# Patient Record
Sex: Female | Born: 1980 | Race: White | Hispanic: No | Marital: Single | Smoking: Never smoker
Health system: Southern US, Community
[De-identification: ages and names within clinical notes are randomized; demographics above are authoritative.]

## PROBLEM LIST (undated history)

## (undated) DIAGNOSIS — E049 Nontoxic goiter, unspecified: Secondary | ICD-10-CM

## (undated) DIAGNOSIS — E221 Hyperprolactinemia: Secondary | ICD-10-CM

## (undated) DIAGNOSIS — B259 Cytomegaloviral disease, unspecified: Secondary | ICD-10-CM

## (undated) HISTORY — PX: WISDOM TOOTH EXTRACTION: SHX21

## (undated) HISTORY — PX: NO PAST SURGERIES: SHX2092

## (undated) HISTORY — DX: Hyperprolactinemia: E22.1

---

## 2004-01-06 DIAGNOSIS — B259 Cytomegaloviral disease, unspecified: Secondary | ICD-10-CM

## 2004-01-06 HISTORY — DX: Cytomegaloviral disease, unspecified: B25.9

## 2011-04-23 ENCOUNTER — Other Ambulatory Visit: Payer: Self-pay | Admitting: Obstetrics and Gynecology

## 2011-04-23 DIAGNOSIS — N6320 Unspecified lump in the left breast, unspecified quadrant: Secondary | ICD-10-CM

## 2011-04-27 ENCOUNTER — Ambulatory Visit
Admission: RE | Admit: 2011-04-27 | Discharge: 2011-04-27 | Disposition: A | Discharge status: Home / Self Care | Payer: BC Managed Care – PPO | Source: Ambulatory Visit | Attending: Obstetrics and Gynecology | Admitting: Obstetrics and Gynecology

## 2011-04-27 DIAGNOSIS — N6320 Unspecified lump in the left breast, unspecified quadrant: Secondary | ICD-10-CM

## 2013-03-31 ENCOUNTER — Encounter (INDEPENDENT_AMBULATORY_CARE_PROVIDER_SITE_OTHER): Payer: Self-pay | Admitting: General Surgery

## 2013-03-31 ENCOUNTER — Ambulatory Visit (INDEPENDENT_AMBULATORY_CARE_PROVIDER_SITE_OTHER): Payer: BC Managed Care – PPO | Admitting: General Surgery

## 2013-03-31 DIAGNOSIS — K8020 Calculus of gallbladder without cholecystitis without obstruction: Secondary | ICD-10-CM | POA: Insufficient documentation

## 2013-03-31 NOTE — Progress Notes (Signed)
Patient ID: Miranda Norman, female   DOB: [birth year], 33 y.o.   MRN: [id number]  Chief Complaint  Patient presents with  . New Evaluation    eval GB    HPI Miranda Norman is a 33 y.o. female.  She is referred by Dr. Keturah Barreobert Robbins at [facility] [place] physician [place] in [place] for evaluation and management of symptomatic gallstones.  This patient began having intermittent episodes of right upper quadrant pain in August or September of last year. Usually nocturnal, sometimes after Bar-B-Q or [place] food. A couple of episodes last fall were severe. These are always self-limited and will occur about every 2 weeks. No nausea vomiting fever or diarrhea.  In October of 2014 she had lab work which was normal. She had an ultrasound which showed a few small gallstones and normal common bile duct. Things  settled down but now she is developed recurrent attacks. Nothing as severe as it was last fall but still troublesome and sometimes postprandially. There's been no history of liver disease cardiac disease pulmonary disease or urologic problems.She is in no distress today.  Past history reveals that she is healthy. Takes birth control pills. Developed a rash to penicillin as a child  History reveals her mother had a complicated gallbladder operation with prolonged drainage possible common duct stones ultimately resolved [facility]. Grandmother had breast cancer.  SH - she is single has no children has a fiancem,  works for a [employer], denies tobacco.  HPI  History reviewed. No pertinent past medical history.  History reviewed. No pertinent past surgical history.  Family History  Problem Relation Age of Onset  . Asthma Mother     Social History History  Substance Use Topics  . Smoking status: Never Smoker   . Smokeless tobacco: Never Used  . Alcohol Use: Yes     Comment: occ    Allergies  Allergen Reactions  . Ceclor [Cefaclor]   . Penicillins Hives     Current Outpatient Prescriptions  Medication Sig Dispense Refill  . Norethindrone Acetate-Ethinyl Estrad-FE (GILDESS 24 FE) 1-20 MG-MCG(24) tablet Take 1 tablet by mouth daily.       No current facility-administered medications for this visit.    Review of Systems Review of Systems  Constitutional: Negative for fever, chills and unexpected weight change.  HENT: Negative for congestion, hearing loss, sore throat, trouble swallowing and voice change.   Eyes: Negative for visual disturbance.  Respiratory: Negative for cough and wheezing.   Cardiovascular: Negative for chest pain, palpitations and leg swelling.  Gastrointestinal: Positive for abdominal pain. Negative for nausea, vomiting, diarrhea, constipation, blood in stool, abdominal distention and anal bleeding.  Genitourinary: Negative for hematuria, vaginal bleeding and difficulty urinating.  Musculoskeletal: Negative for arthralgias.  Skin: Negative for rash and wound.  Neurological: Negative for seizures, syncope and headaches.  Hematological: Negative for adenopathy. Does not bruise/bleed easily.  Psychiatric/Behavioral: Negative for confusion.    There were no vitals taken for this visit.  Physical Exam Physical Exam  Constitutional: She is oriented to person, place, and time. She appears well-developed and well-nourished. No distress.  HENT:  Head: Normocephalic and atraumatic.  Nose: Nose normal.  Mouth/Throat: No oropharyngeal exudate.  Eyes: Conjunctivae and EOM are normal. Pupils are equal, round, and reactive to light. Left eye exhibits no discharge. No scleral icterus.  Neck: Neck supple. No JVD present. No tracheal deviation present. No thyromegaly present.  Cardiovascular: Normal rate, regular rhythm, normal heart sounds and  intact distal pulses.   No murmur heard. Pulmonary/Chest: Effort normal and breath sounds normal. No respiratory distress. She has no wheezes. She has no rales. She exhibits no  tenderness.  Abdominal: Soft. Bowel sounds are normal. She exhibits no distension and no mass. There is no tenderness. There is no rebound and no guarding.  Musculoskeletal: She exhibits no edema and no tenderness.  Lymphadenopathy:    She has no cervical adenopathy.  Neurological: She is alert and oriented to person, place, and time. She exhibits normal muscle tone. Coordination normal.  Skin: Skin is warm. No rash noted. She is not diaphoretic. No erythema. No pallor.  Psychiatric: She has a normal mood and affect. Her behavior is normal. Judgment and thought content normal.    Data Reviewed Dr. Sherral Hammers office notes, lab work, ultrasound  Assessment    Chronic cholecystitis with cholelithiasis. She is having fairly typical episodes of biliary colic, but no complications to date.     Plan    I advised her to have an elective laparoscopic cholecystectomy with cholangiogram, possible open. With a long talk about this. She knows that she needs to do this. She wants to go home and discuss this with her mother and call back  to schedule.  Low-fat diet advised  I discussed the indications, details, techniques, and numerous risks of gallbladder surgery with her. She's where the risk of bleeding, infection, conversion to open laparotomy, injury to adjacent organs with major apparent, bile leak, wound hernia and other unforeseen problems. She understands all these issues. All of her questions are answered. She agrees with this plan.        Angelia Mould. Derrell Lolling, M.D., [facility] General and Minimally invasive Surgery Breast and Colorectal Surgery Office:   [phone] Pager:   [phone]  03/31/2013, 3:29 PM

## 2013-03-31 NOTE — Patient Instructions (Signed)
You are having gallbladder attacks due to your gallstones.  This will continue until you have an operation.  I don't think you have had a complication yet.  Please call back when you are ready to schedule your laparoscopic cholecystectomy with cholangiogram.    Laparoscopic Cholecystectomy Laparoscopic cholecystectomy is surgery to remove the gallbladder. The gallbladder is located in the upper right part of the abdomen, behind the liver. It is a storage sac for bile produced in the liver. Bile aids in the digestion and absorption of fats. Cholecystectomy is often done for inflammation of the gallbladder (cholecystitis). This condition is usually caused by a buildup of gallstones (cholelithiasis) in your gallbladder. Gallstones can block the flow of bile, resulting in inflammation and pain. In severe cases, emergency surgery may be required. When emergency surgery is not required, you will have time to prepare for the procedure. Laparoscopic surgery is an alternative to open surgery. Laparoscopic surgery has a shorter recovery time. Your common bile duct may also need to be examined during the procedure. If stones are found in the common bile duct, they may be removed. LET [facility] PROVID[facility] KNOW ABOUT:  Any allergies you have.  All medicines you are taking, including vitamins, herbs, eye drops, creams, and over-the-counter medicines.  Previous problems you or members of your family have had with the use of anesthetics.  Any blood disorders you have.  Previous surgeries you have had.  Medical conditions you have. RISKS AND COMPLICATIONS Generally, this is a safe procedure. However, as with any procedure, complications can occur. Possible complications include:  Infection.  Damage to the common bile duct, nerves, arteries, veins, or other internal organs such as the stomach, liver, or intestines.  Bleeding.  A stone may remain in the common bile duct.  A bile leak from the  cyst duct that is clipped when your gallbladder is removed.  The need to convert to open surgery, which requires a larger incision in the abdomen. This may be necessary if your surgeon thinks it is not safe to continue with a laparoscopic procedure. BEFORE THE PROCEDURE  Ask your health care provider about changing or stopping any regular medicines. You will need to stop taking aspirin or blood thinners at least 5 days prior to surgery.  Do not eat or drink anything after midnight the night before surgery.  Let your health care provider know if you develop a cold or other infectious problem before surgery. PROCEDURE   You will be given medicine to make you sleep through the procedure (general anesthetic). A breathing tube will be placed in your mouth.  When you are asleep, your surgeon will make several small cuts (incisions) in your abdomen.  A thin, lighted tube with a tiny camera on the end (laparoscope) is inserted through one of the small incisions. The camera on the laparoscope sends a picture to a TV screen in the operating room. This gives the surgeon a good view inside your abdomen.  A gas will be pumped into your abdomen. This expands your abdomen so that the surgeon has more room to perform the surgery.  Other tools needed for the procedure are inserted through the other incisions. The gallbladder is removed through one of the incisions.  After the removal of your gallbladder, the incisions will be closed with stitches, staples, or skin glue. AFTER THE PROCEDURE  You will be taken to a recovery area where your progress will be checked often.  You may be allowed to go  home the same day if your pain is controlled and you can tolerate liquids. Document Released: 12/22/2004 Document Revised: 10/12/2012 Document Reviewed: 08/03/2012 [facility] Patient Information 2014 [place], [place].

## 2013-04-03 ENCOUNTER — Other Ambulatory Visit (INDEPENDENT_AMBULATORY_CARE_PROVIDER_SITE_OTHER): Payer: Self-pay | Admitting: General Surgery

## 2013-04-03 ENCOUNTER — Telehealth (INDEPENDENT_AMBULATORY_CARE_PROVIDER_SITE_OTHER): Payer: Self-pay | Admitting: General Surgery

## 2013-04-03 NOTE — H&P (Signed)
Miranda GrainCrystal J Norman   MRN:  [id number]   Description: 33 year old female  Provider: Ernestene MentionHaywood M Daivon Rayos, MD  Department: [facility]         Diagnoses      Gallstones    -  Primary      574.20            History and Physical   Ernestene MentionHaywood M Miranda Brocato, MD    Status: Signed            Patient ID: Miranda Grainrystal J Marney, female   DOB: December 31, 1980, 33 y.o.   MRN: [id number]                HPI Miranda Norman is a 33 y.o. female.  She is referred by Dr. Keturah Barreobert Robbins at [facility] family physician is in [place] for evaluation and management of symptomatic gallstones.   This patient began having intermittent episodes of right upper quadrant pain in August or September of last year. Usually nocturnal, sometimes after Bar-B-Q or [place] food. A couple of episodes last fall were severe. These are always self-limited and will occur about every 2 weeks. No nausea vomiting fever or diarrhea.   In October of 2014 she had lab work which was normal. She had an ultrasound which showed a few small gallstones and normal common bile duct. Things  settled down but now she is developed recurrent attacks. Nothing as severe as it was last fall but still troublesome and sometimes postprandially. There's been no history of liver disease cardiac disease pulmonary disease or urologic problems.She is in no distress today.   Past history reveals that she is healthy. Takes birth control pills. Developed a rash to penicillin as a child   History reveals her mother had a complicated gallbladder operation with prolonged drainage possible common duct stones ultimately resolved with a [facility]. Grandmother had breast cancer.   SH - she is single has no children has a fiance,  works for a [employer], denies tobacco.           Family History   Problem  Relation  Age of Onset   .  Asthma  Mother        Social History History   Substance Use Topics   .  Smoking status:  Never  Smoker    .  Smokeless tobacco:  Never Used   .  Alcohol Use:  Yes         Comment: occ        Allergies   Allergen  Reactions   .  Ceclor [Cefaclor]     .  Penicillins  Hives         Current Outpatient Prescriptions   Medication  Sig  Dispense  Refill   .  Norethindrone Acetate-Ethinyl Estrad-FE (GILDESS 24 FE) 1-20 MG-MCG(24) tablet  Take 1 tablet by mouth daily.                Review of Systems   Constitutional: Negative for fever, chills and unexpected weight change.  HENT: Negative for congestion, hearing loss, sore throat, trouble swallowing and voice change.   Eyes: Negative for visual disturbance.  Respiratory: Negative for cough and wheezing.   Cardiovascular: Negative for chest pain, palpitations and leg swelling.  Gastrointestinal: Positive for abdominal pain. Negative for nausea, vomiting, diarrhea, constipation, blood in stool, abdominal distention and anal bleeding.  Genitourinary: Negative for hematuria, vaginal bleeding and difficulty urinating.  Musculoskeletal: Negative for  arthralgias.  Skin: Negative for rash and wound.  Neurological: Negative for seizures, syncope and headaches.  Hematological: Negative for adenopathy. Does not bruise/bleed easily.  Psychiatric/Behavioral: Negative for confusion.        Physical Exam  Constitutional: She is oriented to person, place, and time. She appears well-developed and well-nourished. No distress.  HENT:   Head: Normocephalic and atraumatic.   Nose: Nose normal.   Mouth/Throat: No oropharyngeal exudate.  Eyes: Conjunctivae and EOM are normal. Pupils are equal, round, and reactive to light. Left eye exhibits no discharge. No scleral icterus.  Neck: Neck supple. No JVD present. No tracheal deviation present. No thyromegaly present.  Cardiovascular: Normal rate, regular rhythm, normal heart sounds and intact distal pulses.    No murmur heard. Pulmonary/Chest: Effort normal and breath sounds normal. No  respiratory distress. She has no wheezes. She has no rales. She exhibits no tenderness.  Abdominal: Soft. Bowel sounds are normal. She exhibits no distension and no mass. There is no tenderness. There is no rebound and no guarding.  Musculoskeletal: She exhibits no edema and no tenderness.  Lymphadenopathy:    She has no cervical adenopathy.  Neurological: She is alert and oriented to person, place, and time. She exhibits normal muscle tone. Coordination normal.  Skin: Skin is warm. No rash noted. She is not diaphoretic. No erythema. No pallor.  Psychiatric: She has a normal mood and affect. Her behavior is normal. Judgment and thought content normal.      Data Reviewed Dr. Sherral Hammers office notes, lab work, ultrasound   Assessment    Chronic cholecystitis with cholelithiasis. She is having fairly typical episodes of biliary colic, but no complications to date.      Plan    I advised her to have an elective laparoscopic cholecystectomy with cholangiogram, possible open. With a long talk about this. She knows that she needs to do this. She wants to go home and discuss this with her mother and call back  to schedule.   Low-fat diet advised   I discussed the indications, details, techniques, and numerous risks of gallbladder surgery with her. She's where the risk of bleeding, infection, conversion to open laparotomy, injury to adjacent organs with major apparent, bile leak, wound hernia and other unforeseen problems. She understands all these issues. All of her questions are answered. She agrees with this plan.          Angelia Mould. Derrell Lolling, M.D., [facility] General and Minimally invasive Surgery Breast and Colorectal Surgery Office:   [phone] Pager:   [phone]

## 2013-04-03 NOTE — Telephone Encounter (Signed)
Pt called into surgery scheduling to ask what day this week her surgery can be?  States you informed her she could go this week during your emergency cases as she is back packing at the end of may  There are no orders

## 2013-04-03 NOTE — Telephone Encounter (Signed)
[facility]

## 2013-04-03 NOTE — Telephone Encounter (Signed)
Orders and posting template entered into at that. Okay to do surgery any day this week.   hmi

## 2013-04-03 NOTE — Patient Instructions (Addendum)
20 Mairely J Mcwhirt  04/03/2013   Your procedure is scheduled on: 04/05/13  Report to [facility] at 7:30 AM.  Call this number if you have problems the morning of surgery [phone]: [phone]   Remember:   Do not eat food or drink liquids After Midnight.   Do not wear jewelry, make-up or nail polish.  Do not wear lotions, powders, or perfumes. You may wear deodorant.  Do not shave 48 hours prior to surgery. Men may shave face and neck.  Do not bring valuables to the hospital.  Contacts, dentures or bridgework may not be worn into surgery.   Patients discharged the day of surgery will not be allowed to drive home.  Name and phone number of your driver: Earl ManyDaniel Kern [phone]    Please read over the following fact sheets that you were given:[place] preparing for surgery sheet  Birdie Sonsachel Simon Llamas, RN  pre op nurse call if needed [phone]    FAILURE TO FOLLOW THESE INSTRUCTIONS MAY RESULT IN CANCELLATION OF YOUR SURGERY   Patient Signature: ___________________________________________

## 2013-04-04 ENCOUNTER — Encounter (HOSPITAL_COMMUNITY): Payer: Self-pay

## 2013-04-04 ENCOUNTER — Encounter (HOSPITAL_COMMUNITY): Payer: Self-pay | Admitting: Pharmacy Technician

## 2013-04-04 ENCOUNTER — Encounter (HOSPITAL_COMMUNITY)
Admission: RE | Admit: 2013-04-04 | Discharge: 2013-04-04 | Disposition: A | Discharge status: Home / Self Care | Payer: BC Managed Care – PPO | Source: Ambulatory Visit | Attending: General Surgery | Admitting: General Surgery

## 2013-04-04 HISTORY — DX: Cytomegaloviral disease, unspecified: B25.9

## 2013-04-04 HISTORY — DX: Nontoxic goiter, unspecified: E04.9

## 2013-04-04 LAB — CBC WITH DIFFERENTIAL/PLATELET
Basophils Absolute: 0 10*3/uL (ref 0.0–0.1)
Basophils Relative: 0 % (ref 0–1)
Eosinophils Absolute: 0.2 10*3/uL (ref 0.0–0.7)
Eosinophils Relative: 2 % (ref 0–5)
HCT: 42.9 % (ref 36.0–46.0)
Hemoglobin: 14.9 g/dL (ref 12.0–15.0)
Lymphocytes Relative: 24 % (ref 12–46)
Lymphs Abs: 1.8 10*3/uL (ref 0.7–4.0)
MCH: 29.4 pg (ref 26.0–34.0)
MCHC: 34.7 g/dL (ref 30.0–36.0)
MCV: 84.6 fL (ref 78.0–100.0)
Monocytes Absolute: 0.4 10*3/uL (ref 0.1–1.0)
Monocytes Relative: 5 % (ref 3–12)
Neutro Abs: 5.1 10*3/uL (ref 1.7–7.7)
Neutrophils Relative %: 69 % (ref 43–77)
Platelets: UNDETERMINED 10*3/uL (ref 150–400)
RBC: 5.07 MIL/uL (ref 3.87–5.11)
RDW: 13 % (ref 11.5–15.5)
WBC: 7.5 10*3/uL (ref 4.0–10.5)

## 2013-04-04 LAB — COMPREHENSIVE METABOLIC PANEL
ALT: 20 U/L (ref 0–35)
AST: 27 U/L (ref 0–37)
Albumin: 3.8 g/dL (ref 3.5–5.2)
Alkaline Phosphatase: 54 U/L (ref 39–117)
BUN: 10 mg/dL (ref 6–23)
CO2: 25 mEq/L (ref 19–32)
Calcium: 9.3 mg/dL (ref 8.4–10.5)
Chloride: 101 mEq/L (ref 96–112)
Creatinine, Ser: 0.67 mg/dL (ref 0.50–1.10)
GFR calc Af Amer: >90 mL/min (ref >90)
GFR calc non Af Amer: >90 mL/min (ref >90)
Glucose, Bld: 94 mg/dL (ref 70–99)
Potassium: 4.3 mEq/L (ref 3.7–5.3)
Sodium: 138 mEq/L (ref 137–147)
Total Bilirubin: 0.4 mg/dL (ref 0.3–1.2)
Total Protein: 6.9 g/dL (ref 6.0–8.3)

## 2013-04-04 LAB — HCG, SERUM, QUALITATIVE: Preg, Serum: NEGATIVE

## 2013-04-04 NOTE — H&P (Signed)
Miranda Norman    MRN:  [id number]   Description: 33 year old female  Provider: Ernestene Mention, MD  Department: [facility]          Diagnoses      Gallstones    -  Primary      574.20           History and Physical   Ernestene Mention, MD       Status: Signed            Patient ID: Miranda Norman, female   DOB: Apr 29, 1980, 33 y.o.   MRN: [id number]               HPI Miranda Norman is a 34 y.o. female.  She is referred by Dr. Keturah Barre at [facility] family physician is in [place] for evaluation and management of symptomatic gallstones.   This patient began having intermittent episodes of right upper quadrant pain in August or September of last year. Usually nocturnal, sometimes after Bar-B-Q or [place] food. A couple of episodes last fall were severe. These are always self-limited and will occur about every 2 weeks. No nausea vomiting fever or diarrhea.   In October of 2014 she had lab work which was normal. She had an ultrasound which showed a few small gallstones and normal common bile duct. Things  settled down but now she is developed recurrent attacks. Nothing as severe as it was last fall but still troublesome and sometimes postprandially. There's been no history of liver disease cardiac disease pulmonary disease or urologic problems.She is in no distress today.   Past history reveals that she is healthy. Takes birth control pills. Developed a rash to penicillin as a child   History reveals her mother had a complicated gallbladder operation with prolonged drainage possible common duct stones ultimately resolved with a stintt at [facility]. Grandmother had breast cancer.   SH - she is single has no children has a fiancem,  works for a [employer], denies tobacco.           Family History   Problem  Relation  Age of Onset   .  Asthma  Mother          Social History History   Substance Use Topics   .  Smoking status:   Never Smoker    .  Smokeless tobacco:  Never Used   .  Alcohol Use:  Yes         Comment: occ         Allergies   Allergen  Reactions   .  Ceclor [Cefaclor]     .  Penicillins  Hives         Current Outpatient Prescriptions   Medication  Sig  Dispense  Refill   .  Norethindrone Acetate-Ethinyl Estrad-FE (GILDESS 24 FE) 1-20 MG-MCG(24) tablet  Take 1 tablet by mouth daily.                  Review of Systems  Constitutional: Negative for fever, chills and unexpected weight change.  HENT: Negative for congestion, hearing loss, sore throat, trouble swallowing and voice change.   Eyes: Negative for visual disturbance.  Respiratory: Negative for cough and wheezing.   Cardiovascular: Negative for chest pain, palpitations and leg swelling.  Gastrointestinal: Positive for abdominal pain. Negative for nausea, vomiting, diarrhea, constipation, blood in stool, abdominal distention and anal bleeding.  Genitourinary: Negative for hematuria, vaginal bleeding and  difficulty urinating.  Musculoskeletal: Negative for arthralgias.  Skin: Negative for rash and wound.  Neurological: Negative for seizures, syncope and headaches.  Hematological: Negative for adenopathy. Does not bruise/bleed easily.  Psychiatric/Behavioral: Negative for confusion.       Physical Exam   Constitutional: She is oriented to person, place, and time. She appears well-developed and well-nourished. No distress.  HENT:   Head: Normocephalic and atraumatic.   Nose: Nose normal.   Mouth/Throat: No oropharyngeal exudate.  Eyes: Conjunctivae and EOM are normal. Pupils are equal, round, and reactive to light. Left eye exhibits no discharge. No scleral icterus.  Neck: Neck supple. No JVD present. No tracheal deviation present. No thyromegaly present.  Cardiovascular: Normal rate, regular rhythm, normal heart sounds and intact distal pulses.    No murmur heard. Pulmonary/Chest: Effort normal and breath sounds  normal. No respiratory distress. She has no wheezes. She has no rales. She exhibits no tenderness.  Abdominal: Soft. Bowel sounds are normal. She exhibits no distension and no mass. There is no tenderness. There is no rebound and no guarding.  Musculoskeletal: She exhibits no edema and no tenderness.  Lymphadenopathy:    She has no cervical adenopathy.  Neurological: She is alert and oriented to person, place, and time. She exhibits normal muscle tone. Coordination normal.  Skin: Skin is warm. No rash noted. She is not diaphoretic. No erythema. No pallor.  Psychiatric: She has a normal mood and affect. Her behavior is normal. Judgment and thought content normal.      Data Reviewed Dr. Sherral Hammersobbins office notes, lab work, ultrasound   Assessment    Chronic cholecystitis with cholelithiasis. She is having fairly typical episodes of biliary colic, but no complications to date.      Plan    I advised her to have an elective laparoscopic cholecystectomy with cholangiogram, possible open. With a long talk about this. She knows that she needs to do this. She wants to go home and discuss this with her mother and call back  to schedule.   Low-fat diet advised   I discussed the indications, details, techniques, and numerous risks of gallbladder surgery with her. She's where the risk of bleeding, infection, conversion to open laparotomy, injury to adjacent organs with major apparent, bile leak, wound hernia and other unforeseen problems. She understands all these issues. All of her questions are answered. She agrees with this plan.           Angelia MouldHaywood M. Derrell LollingIngram, M.D., [facility] General and Minimally invasive Surgery Breast and Colorectal Surgery Office:   [phone] Pager:   [phone]

## 2013-04-05 ENCOUNTER — Encounter (HOSPITAL_COMMUNITY): Payer: Self-pay | Admitting: *Deleted

## 2013-04-05 ENCOUNTER — Ambulatory Visit (HOSPITAL_COMMUNITY)
Admission: RE | Admit: 2013-04-05 | Discharge: 2013-04-05 | Disposition: A | Discharge status: Home / Self Care | Payer: BC Managed Care – PPO | Source: Ambulatory Visit | Attending: General Surgery | Admitting: General Surgery

## 2013-04-05 ENCOUNTER — Encounter (HOSPITAL_COMMUNITY)
Admission: RE | Disposition: A | Discharge status: Home / Self Care | Payer: Self-pay | Source: Ambulatory Visit | Attending: General Surgery

## 2013-04-05 ENCOUNTER — Encounter (HOSPITAL_COMMUNITY): Payer: BC Managed Care – PPO | Admitting: Anesthesiology

## 2013-04-05 ENCOUNTER — Ambulatory Visit (HOSPITAL_COMMUNITY): Payer: BC Managed Care – PPO

## 2013-04-05 ENCOUNTER — Ambulatory Visit (HOSPITAL_COMMUNITY): Payer: BC Managed Care – PPO | Admitting: Anesthesiology

## 2013-04-05 DIAGNOSIS — K8010 Calculus of gallbladder with chronic cholecystitis without obstruction: Secondary | ICD-10-CM

## 2013-04-05 DIAGNOSIS — Z803 Family history of malignant neoplasm of breast: Secondary | ICD-10-CM | POA: Insufficient documentation

## 2013-04-05 DIAGNOSIS — Z79899 Other long term (current) drug therapy: Secondary | ICD-10-CM | POA: Insufficient documentation

## 2013-04-05 DIAGNOSIS — K8020 Calculus of gallbladder without cholecystitis without obstruction: Secondary | ICD-10-CM | POA: Diagnosis present

## 2013-04-05 DIAGNOSIS — R1011 Right upper quadrant pain: Secondary | ICD-10-CM | POA: Insufficient documentation

## 2013-04-05 HISTORY — PX: CHOLECYSTECTOMY: SHX55

## 2013-04-05 SURGERY — LAPAROSCOPIC CHOLECYSTECTOMY WITH INTRAOPERATIVE CHOLANGIOGRAM
Anesthesia: General | Site: Abdomen

## 2013-04-05 MED ORDER — NEOSTIGMINE METHYLSULFATE 1 MG/ML IJ SOLN
INTRAMUSCULAR | Status: DC | PRN
  Administered 2013-04-05: 4 mg via INTRAVENOUS

## 2013-04-05 MED ORDER — MIDAZOLAM HCL 2 MG/2ML IJ SOLN
INTRAMUSCULAR | Status: AC
  Filled 2013-04-05: qty 2

## 2013-04-05 MED ORDER — DEXAMETHASONE SODIUM PHOSPHATE 10 MG/ML IJ SOLN
INTRAMUSCULAR | Status: DC | PRN
  Administered 2013-04-05: 10 mg via INTRAVENOUS

## 2013-04-05 MED ORDER — ROCURONIUM BROMIDE 100 MG/10ML IV SOLN
INTRAVENOUS | Status: AC
  Filled 2013-04-05: qty 1

## 2013-04-05 MED ORDER — PROMETHAZINE HCL 25 MG/ML IJ SOLN
INTRAMUSCULAR | Status: AC
  Filled 2013-04-05: qty 1

## 2013-04-05 MED ORDER — ONDANSETRON HCL 4 MG/2ML IJ SOLN
INTRAMUSCULAR | Status: AC
  Filled 2013-04-05: qty 2

## 2013-04-05 MED ORDER — CIPROFLOXACIN IN D5W 400 MG/200ML IV SOLN
INTRAVENOUS | Status: AC
  Filled 2013-04-05: qty 200

## 2013-04-05 MED ORDER — HYDROMORPHONE HCL PF 1 MG/ML IJ SOLN
0.2500 mg | INTRAMUSCULAR | Status: DC | PRN
  Administered 2013-04-05 (×2): 0.25 mg via INTRAVENOUS

## 2013-04-05 MED ORDER — SODIUM CHLORIDE 0.9 % IJ SOLN: 3.0000 mL | Freq: Two times a day (BID) | INTRAMUSCULAR | Status: DC

## 2013-04-05 MED ORDER — DEXAMETHASONE SODIUM PHOSPHATE 10 MG/ML IJ SOLN
INTRAMUSCULAR | Status: AC
  Filled 2013-04-05: qty 1

## 2013-04-05 MED ORDER — SUCCINYLCHOLINE CHLORIDE 20 MG/ML IJ SOLN
INTRAMUSCULAR | Status: DC | PRN
Start: 2013-04-05 — End: 2013-04-05
  Administered 2013-04-05: 100 mg via INTRAVENOUS

## 2013-04-05 MED ORDER — HYDROMORPHONE HCL PF 1 MG/ML IJ SOLN
INTRAMUSCULAR | Status: AC
  Filled 2013-04-05: qty 1

## 2013-04-05 MED ORDER — BUPIVACAINE-EPINEPHRINE PF 0.5-1:200000 % IJ SOLN
INTRAMUSCULAR | Status: AC
  Filled 2013-04-05: qty 30

## 2013-04-05 MED ORDER — SODIUM CHLORIDE 0.9 % IJ SOLN: 3.0000 mL | INTRAMUSCULAR | Status: DC | PRN

## 2013-04-05 MED ORDER — SODIUM CHLORIDE 0.9 % IV SOLN: 250.0000 mL | INTRAVENOUS | Status: DC | PRN

## 2013-04-05 MED ORDER — GLYCOPYRROLATE 0.2 MG/ML IJ SOLN
INTRAMUSCULAR | Status: AC
  Filled 2013-04-05: qty 3

## 2013-04-05 MED ORDER — BUPIVACAINE-EPINEPHRINE 0.5% -1:200000 IJ SOLN
INTRAMUSCULAR | Status: DC | PRN
  Administered 2013-04-05: 15 mL

## 2013-04-05 MED ORDER — BUPIVACAINE-EPINEPHRINE PF 0.5-1:200000 % IJ SOLN
INTRAMUSCULAR | Status: AC
Start: 2013-04-05 — End: 2013-04-05
  Filled 2013-04-05: qty 30

## 2013-04-05 MED ORDER — OXYCODONE HCL 5 MG PO TABS
5.0000 mg | ORAL_TABLET | ORAL | Status: DC | PRN
  Administered 2013-04-05: 5 mg via ORAL
  Filled 2013-04-05: qty 1

## 2013-04-05 MED ORDER — FENTANYL CITRATE 0.05 MG/ML IJ SOLN: 25.0000 ug | INTRAMUSCULAR | Status: DC | PRN

## 2013-04-05 MED ORDER — NEOSTIGMINE METHYLSULFATE 1 MG/ML IJ SOLN
INTRAMUSCULAR | Status: AC
  Filled 2013-04-05: qty 10

## 2013-04-05 MED ORDER — ACETAMINOPHEN 650 MG RE SUPP
650.0000 mg | RECTAL | Status: DC | PRN
  Filled 2013-04-05: qty 1

## 2013-04-05 MED ORDER — FENTANYL CITRATE 0.05 MG/ML IJ SOLN
INTRAMUSCULAR | Status: AC
  Filled 2013-04-05: qty 5

## 2013-04-05 MED ORDER — PROPOFOL 10 MG/ML IV BOLUS
INTRAVENOUS | Status: AC
  Filled 2013-04-05: qty 20

## 2013-04-05 MED ORDER — HYDROCODONE-ACETAMINOPHEN 5-325 MG PO TABS: 1.0000 | ORAL_TABLET | Freq: Four times a day (QID) | ORAL | Status: DC | PRN

## 2013-04-05 MED ORDER — ONDANSETRON HCL 4 MG/2ML IJ SOLN
INTRAMUSCULAR | Status: DC | PRN
  Administered 2013-04-05: 4 mg via INTRAVENOUS

## 2013-04-05 MED ORDER — FENTANYL CITRATE 0.05 MG/ML IJ SOLN
INTRAMUSCULAR | Status: AC
  Filled 2013-04-05: qty 2

## 2013-04-05 MED ORDER — ROCURONIUM BROMIDE 100 MG/10ML IV SOLN
INTRAVENOUS | Status: DC | PRN
  Administered 2013-04-05 (×2): 5 mg via INTRAVENOUS
  Administered 2013-04-05: 30 mg via INTRAVENOUS

## 2013-04-05 MED ORDER — IOHEXOL 300 MG/ML  SOLN
INTRAMUSCULAR | Status: DC | PRN
  Administered 2013-04-05: 5 mL via INTRAVENOUS

## 2013-04-05 MED ORDER — ACETAMINOPHEN 325 MG PO TABS: 650.0000 mg | ORAL_TABLET | ORAL | Status: DC | PRN

## 2013-04-05 MED ORDER — MIDAZOLAM HCL 5 MG/5ML IJ SOLN
INTRAMUSCULAR | Status: DC | PRN
  Administered 2013-04-05: 2 mg via INTRAVENOUS

## 2013-04-05 MED ORDER — CIPROFLOXACIN IN D5W 400 MG/200ML IV SOLN
400.0000 mg | INTRAVENOUS | Status: AC
  Administered 2013-04-05: 400 mg via INTRAVENOUS

## 2013-04-05 MED ORDER — FENTANYL CITRATE 0.05 MG/ML IJ SOLN
INTRAMUSCULAR | Status: DC | PRN
  Administered 2013-04-05: 100 ug via INTRAVENOUS
  Administered 2013-04-05 (×5): 50 ug via INTRAVENOUS

## 2013-04-05 MED ORDER — PROPOFOL 10 MG/ML IV BOLUS
INTRAVENOUS | Status: DC | PRN
  Administered 2013-04-05: 150 mg via INTRAVENOUS

## 2013-04-05 MED ORDER — SODIUM CHLORIDE 0.9 % IV SOLN: INTRAVENOUS | Status: DC

## 2013-04-05 MED ORDER — PROMETHAZINE HCL 25 MG/ML IJ SOLN
6.2500 mg | INTRAMUSCULAR | Status: DC | PRN
  Administered 2013-04-05: 6.25 mg via INTRAVENOUS

## 2013-04-05 MED ORDER — LACTATED RINGERS IV SOLN
INTRAVENOUS | Status: DC
  Administered 2013-04-05: 1000 mL via INTRAVENOUS
  Administered 2013-04-05: 14:00:00 via INTRAVENOUS

## 2013-04-05 MED ORDER — GLYCOPYRROLATE 0.2 MG/ML IJ SOLN
INTRAMUSCULAR | Status: DC | PRN
  Administered 2013-04-05: 0.6 mg via INTRAVENOUS

## 2013-04-05 SURGICAL SUPPLY — 33 items
APPLIER CLIP ROT 10 11.4 M/L (STAPLE) ×3
BENZOIN TINCTURE PRP APPL 2/3 (GAUZE/BANDAGES/DRESSINGS) ×3 IMPLANT
CANISTER SUCTION 2500CC (MISCELLANEOUS) ×3 IMPLANT
CLIP APPLIE ROT 10 11.4 M/L (STAPLE) ×1 IMPLANT
CLOSURE WOUND 1/2 X4 (GAUZE/BANDAGES/DRESSINGS) ×1
COVER MAYO STAND STRL (DRAPES) ×3 IMPLANT
DECANTER SPIKE VIAL GLASS SM (MISCELLANEOUS) ×3 IMPLANT
DERMABOND ADVANCED (GAUZE/BANDAGES/DRESSINGS) ×2
DERMABOND ADVANCED .7 DNX12 (GAUZE/BANDAGES/DRESSINGS) ×1 IMPLANT
DRAPE C-ARM 42X120 X-RAY (DRAPES) ×3 IMPLANT
DRAPE LAPAROSCOPIC ABDOMINAL (DRAPES) ×3 IMPLANT
DRAPE UTILITY XL STRL (DRAPES) ×3 IMPLANT
ELECT REM PT RETURN 9FT ADLT (ELECTROSURGICAL) ×3
ELECTRODE REM PT RTRN 9FT ADLT (ELECTROSURGICAL) ×1 IMPLANT
GLOVE EUDERMIC 7 POWDERFREE (GLOVE) ×3 IMPLANT
GOWN STRL REUS W/TWL XL LVL3 (GOWN DISPOSABLE) ×12 IMPLANT
HEMOSTAT SNOW SURGICEL 2X4 (HEMOSTASIS) IMPLANT
KIT BASIN OR (CUSTOM PROCEDURE TRAY) ×3 IMPLANT
POUCH SPECIMEN RETRIEVAL 10MM (ENDOMECHANICALS) ×3 IMPLANT
SCISSORS LAP 5X35 DISP (ENDOMECHANICALS) ×3 IMPLANT
SET CHOLANGIOGRAPH MIX (MISCELLANEOUS) ×3 IMPLANT
SET IRRIG TUBING LAPAROSCOPIC (IRRIGATION / IRRIGATOR) ×3 IMPLANT
SLEEVE XCEL OPT CAN 5 100 (ENDOMECHANICALS) ×3 IMPLANT
SOLUTION ANTI FOG 6CC (MISCELLANEOUS) ×3 IMPLANT
STRIP CLOSURE SKIN 1/2X4 (GAUZE/BANDAGES/DRESSINGS) ×2 IMPLANT
SUT MNCRL AB 4-0 PS2 18 (SUTURE) ×6 IMPLANT
TOWEL OR 17X26 10 PK STRL BLUE (TOWEL DISPOSABLE) ×3 IMPLANT
TOWEL OR NON WOVEN STRL DISP B (DISPOSABLE) ×3 IMPLANT
TRAY LAP CHOLE (CUSTOM PROCEDURE TRAY) ×3 IMPLANT
TROCAR BLADELESS OPT 5 100 (ENDOMECHANICALS) ×3 IMPLANT
TROCAR XCEL BLUNT TIP 100MML (ENDOMECHANICALS) ×3 IMPLANT
TROCAR XCEL NON-BLD 11X100MML (ENDOMECHANICALS) ×3 IMPLANT
TUBING INSUFFLATION 10FT LAP (TUBING) ×3 IMPLANT

## 2013-04-05 NOTE — Interval H&P Note (Signed)
History and Physical Interval Note:  04/05/2013 8:47 AM  Miranda Norman  has presented today for surgery, with the diagnosis of gall stones   The various methods of treatment have been discussed with the patient and family. After consideration of risks, benefits and other options for treatment, the patient has consented to  Procedure(s): LAPAROSCOPIC CHOLECYSTECTOMY WITH INTRAOPERATIVE CHOLANGIOGRAM (N/A) as a surgical intervention .  The patient's history has been reviewed, patient examined, no change in status, stable for surgery.  I have reviewed the patient's chart and labs.  Questions were answered to the patient's satisfaction.     Ernestene MentionINGRAM,Tsuneo Faison M

## 2013-04-05 NOTE — Discharge Instructions (Signed)
Follow up with Dr. Derrell LollingIngram in 3 weeks. Call office tomorrow for appointment [phone].   Laparoscopic Cholecystectomy, Care After Refer to this sheet in the next few weeks. These instructions provide you with information on caring for yourself after your procedure. Your health care provider may also give you more specific instructions. Your treatment has been planned according to current medical practices, but problems sometimes occur. Call your health care provider if you have any problems or questions after your procedure.  WHAT TO EXPECT AFTER THE PROCEDURE After your procedure, it is typical to have the following:  Pain at your incision sites. You will be given pain medicines to control the pain.  Mild nausea or vomiting. This should improve after the first 24 hours.  Bloating and possibly shoulder pain from the gas used during the procedure. This will improve after the first 24 hours.  HOME CARE INSTRUCTIONS   Change bandages (dressings) as directed by your health care provider.  Keep the wound dry and clean. You may wash the wound gently with soap and water. Gently blot or dab the area dry.  Do not take baths or use swimming pools or hot tubs for 2 weeks or until your health care provider approves.  Only take over-the-counter or prescription medicines as directed by your health care provider.  Continue your normal diet as directed by your health care provider.  Do not lift anything heavier than 10 pounds (4.5 kg) until your health care provider approves.  Do not play contact sports for 1 week or until your health care provider approves.  SEEK MEDICAL CARE IF:   You have redness, swelling, or increasing pain in the wound.  You notice yellowish-white fluid (pus) coming from the wound.  You have drainage from the wound that lasts longer than 1 day.  You notice a bad smell coming from the wound or dressing.  Your surgical cuts (incisions) break open.  SEEK IMMEDIATE MEDICAL  CARE IF:   You develop a rash.  You have difficulty breathing.  You have chest pain.  You have a fever.  You have increasing pain in the shoulders (shoulder strap areas).  You have dizzy episodes or faint while standing.  You have severe abdominal pain.  You feel sick to your stomach (nauseous) or throw up (vomit) and this lasts for more than 1 day. Document Released: 12/22/2004 Document Revised: 10/12/2012 Document Reviewed: 08/03/2012 [facility] Patient Information 2014 [employer] [place].

## 2013-04-05 NOTE — Anesthesia Postprocedure Evaluation (Signed)
  Anesthesia Post-op Note  Patient: Miranda Norman  Procedure(s) Performed: Procedure(s) (LRB): LAPAROSCOPIC CHOLECYSTECTOMY WITH INTRAOPERATIVE CHOLANGIOGRAM (N/A)  Patient Location: PACU  Anesthesia Type: General  Level of Consciousness: awake and alert   Airway and Oxygen Therapy: Patient Spontanous Breathing  Post-op Pain: mild  Post-op Assessment: Post-op Vital signs reviewed, Patient's Cardiovascular Status Stable, Respiratory Function Stable, Patent Airway and No signs of Nausea or vomiting  Last Vitals:  Filed Vitals:   04/05/13 1411  BP: 144/85  Pulse: 65  Temp: 36.9 C  Resp: 18    Post-op Vital Signs: stable   Complications: No apparent anesthesia complications

## 2013-04-05 NOTE — Anesthesia Preprocedure Evaluation (Signed)

## 2013-04-05 NOTE — Transfer of Care (Signed)
Immediate Anesthesia Transfer of Care Note  Patient: Miranda Norman  Procedure(s) Performed: Procedure(s): LAPAROSCOPIC CHOLECYSTECTOMY WITH INTRAOPERATIVE CHOLANGIOGRAM (N/A)  Patient Location: PACU  Anesthesia Type:General  Level of Consciousness: awake, alert , oriented and patient cooperative  Airway & Oxygen Therapy: Patient Spontanous Breathing and Patient connected to face mask oxygen  Post-op Assessment: Report given to PACU RN and Post -op Vital signs reviewed and stable  Post vital signs: Reviewed and stable  Complications: No apparent anesthesia complications

## 2013-04-05 NOTE — Op Note (Signed)
Patient Name:           Miranda Norman   Date of Surgery:        04/05/2013  Pre op Diagnosis:      Chronic cholecystitis with cholelithiasis  Post op Diagnosis:    Same  Procedure:                 Laparoscopic cholecystectomy  Surgeon:                     Angelia MouldHaywood M. Derrell LollingIngram, M.D., FACS  Assistant:                      Aris GeorgiaMegan Dort, [place]  Operative Indications:   Miranda GrainCrystal J Crownover is a 33 y.o. female. She [facility] [place] and management of symptomatic gallstones.  This patient began having intermittent episodes of right upper quadrant pain in August or September of last year. Usually nocturnal, sometimes after Bar-[employer]-Q or [place] food. A couple of episodes last fall were severe. These are always self-limited and will occur about every 2 weeks. No nausea vomiting fever or diarrhea.  In October of 2014 she had lab work which was normal. She had an ultrasound which showed a few small gallstones and normal common bile duct. Things settled down but now she has developed recurrent attacks. Nothing as severe as it was last fall but still troublesome and sometimes postprandially. There's been no history of liver disease cardiac disease pulmonary disease or urologic problems.  Past history reveals that she is healthy. Takes birth control pills. Marland Kitchen.   Operative Findings:    The gallbladder was chronically inflamed, and had extensive soft chronic adhesions to it. The anatomy of the cystic duct and cystic artery were typical. I milked  numerous stone fragments out of the cystic duct. I made four attempts to get a cholangiogram but could never get the contrast to flow through the cystic duct and so we had to abandon that. I felt this was reasonable since her liver function tests are normal. The liver looked normal slightly enlarged. The stomach, duodenum, small intestine, and large intestine were grossly normal to inspection.     Procedure  in Detail:          Following the induction of general endotracheal anesthesia the patient's abdomen was prepped and draped in a sterile fashion. Surgical time out was performed and intravenous antibiotics were given. 0.5% Marcaine with epinephrine was used as local infiltration anesthetic. A vertical incision was made at the lower rim of the umbilicus. The fascia was incised in the midline and the abdominal cavity entered under direct vision. An 11 mm Hassan trocar was inserted and secured with a purse string suture of 0 Vicryl. Pneumoperitoneum was created. Video camera was inserted. An 11 mm trocar was placed in the subxiphoid region and two 5 mm trocars placed in the right upper quadrant. We elevated the fundus of the gallbladder. We spent about 10 or 15 minutes taking the adhesions down until we could clearly see the infundibulum, the cystic duct, the cystic artery. I isolated the cystic duct and the cystic artery and created a large window behind these structures until I could see 2 and only 2 structures going to the gallbladder. I controlled the cystic artery with clips and divided it. I then had a huge window behind the cystic duct. I could feel small stone fragments  in the cystic duct and milked them  back up into the gallbladder. A cholangiogram catheter was inserted into the cystic duct but I could not get the contrast to flow. I removed the cholangiocatheter and milked some more stone fragments out as much as possible. I did this 3 more times and until there were no more stone fragments. I still could not get contrast to flow and so I abandoned the cholangiogram. The cystic duct was secured with multiple little clips and divided. The gallbladder was dissected from its bed with electrocautery, placed in a   specimen bag and removed. The operative field was copiously irrigated. There was no bleeding and no bile leak. The irrigation fluid was evacuated & it was completely clear. The trocars were removed.  The pneumoperitoneum was released. The fascia at the umbilicus was closed with 0 Vicryl sutures and the skin was closed subcuticular suture for Monocryl and Dermabond. The patient tolerated the procedure well taken to PACU in stable condition. EBL 10 cc. Counts correct. Complications none.     Angelia Mould. Derrell Lolling, M.D., FACS General and Minimally Invasive Surgery Breast and Colorectal Surgery  04/05/2013 12:32 PM

## 2013-04-05 NOTE — Preoperative (Signed)
Beta Blockers   Reason not to administer Beta Blockers:Not Applicable 

## 2013-04-06 ENCOUNTER — Encounter (HOSPITAL_COMMUNITY): Payer: Self-pay | Admitting: General Surgery

## 2013-04-26 ENCOUNTER — Ambulatory Visit (INDEPENDENT_AMBULATORY_CARE_PROVIDER_SITE_OTHER): Payer: BC Managed Care – PPO | Admitting: General Surgery

## 2013-04-26 ENCOUNTER — Encounter (INDEPENDENT_AMBULATORY_CARE_PROVIDER_SITE_OTHER): Payer: Self-pay | Admitting: General Surgery

## 2013-04-26 VITALS — BP 115/75 | HR 64 | Temp 98.0°F | Resp 14 | Ht 68.0 in | Wt 183.4 lb

## 2013-04-26 DIAGNOSIS — K8020 Calculus of gallbladder without cholecystitis without obstruction: Secondary | ICD-10-CM

## 2013-04-26 NOTE — Patient Instructions (Signed)
You have recovered from your gallbladder surgery without any obvious complications.  You may resume normal physical activities without restriction.  Be sure to stretch well before and after exercise  Low-fat diet is recommended.  Return to see Dr. Derrell LollingIngram if necessary.

## 2013-04-26 NOTE — Progress Notes (Signed)
Patient ID: Miranda Norman, female   DOB: December 14, 1980, 33 y.o.   MRN: [id number] History: This patient underwent elective laparoscopic cholecystectomy for histologically confirmed chronic cholecystitis with cholelithiasis on 04/05/2013. She feels well. No pain or wound problems. No diarrhea.  Exam:  This patient looks well. No distress Abdomen soft and nontender. Trocar sites healing well.  Assessment: Chronic cholecystitis with cholelithiasis, uneventful recovery following laparoscopic cholecystectomy  Plan: Low fat diet Stay well hydrated Okay to resume normal physical activities Return to see me as necessary.   Angelia MouldHaywood M. Derrell LollingIngram, M.D., [facility] General and Minimally invasive Surgery Breast and Colorectal Surgery Office:   [phone]

## 2013-05-03 ENCOUNTER — Encounter (INDEPENDENT_AMBULATORY_CARE_PROVIDER_SITE_OTHER): Payer: Self-pay

## 2014-07-10 ENCOUNTER — Other Ambulatory Visit: Payer: Self-pay | Admitting: Obstetrics and Gynecology

## 2014-07-11 LAB — CYTOLOGY - PAP

## 2014-09-13 ENCOUNTER — Other Ambulatory Visit: Payer: Self-pay | Admitting: Obstetrics and Gynecology

## 2014-09-13 DIAGNOSIS — E229 Hyperfunction of pituitary gland, unspecified: Principal | ICD-10-CM

## 2014-09-13 DIAGNOSIS — R7989 Other specified abnormal findings of blood chemistry: Secondary | ICD-10-CM

## 2015-04-03 ENCOUNTER — Ambulatory Visit (INDEPENDENT_AMBULATORY_CARE_PROVIDER_SITE_OTHER): Payer: Self-pay | Admitting: Endocrinology

## 2015-04-03 ENCOUNTER — Encounter: Payer: Self-pay | Admitting: Endocrinology

## 2015-04-03 VITALS — BP 124/84 | HR 113 | Temp 98.4°F | Wt 242.0 lb

## 2015-04-03 DIAGNOSIS — E221 Hyperprolactinemia: Secondary | ICD-10-CM

## 2015-04-03 DIAGNOSIS — F329 Major depressive disorder, single episode, unspecified: Secondary | ICD-10-CM

## 2015-04-03 DIAGNOSIS — F32A Depression, unspecified: Secondary | ICD-10-CM

## 2015-04-03 DIAGNOSIS — E049 Nontoxic goiter, unspecified: Secondary | ICD-10-CM | POA: Insufficient documentation

## 2015-04-03 NOTE — Progress Notes (Signed)
Subjective:    Patient ID: Miranda Norman, female    DOB: 1980-06-29, 35 y.o.   MRN: [id number]  HPI Pt had menarche at age [age].  She had regular menses until 2015, when they stopped.  This happened after she started taking risperdal.  She is G0.  She reports 3 months of galactorrhea from both breasts, but no assoc pain.  She does not want a pregnancy now, but would like to preserve fertility for the future.  She took oral contraceptives from approx 2012-2015.  She denies the following: excessive exercise, opiates, brain XRT, brain surgery, cirrhosis,  and seizures.  Pt says she was noted to have a goiter in 2012.  she has no h/o XRT or surgery to the neck.  She had US then, but was told no rx was needed. Past Medical History  Diagnosis Date  . Thyroid goiter 4 years ago    "everything is normal"  . CMV (cytomegalovirus) (HCC) 2006  . Hyperprolactinemia [facility](HCC)     Past Surgical History  Procedure Laterality Date  . Wisdom tooth extraction  in high school  . No past surgeries    . Cholecystectomy N/A 04/05/2013    Procedure: LAPAROSCOPIC CHOLECYSTECTOMY WITH INTRAOPERATIVE CHOLANGIOGRAM;  Surgeon: Ernestene MentionHaywood M Ingram, MD;  Location: [facility];  Service: General;  Laterality: N/A;    Social History   Social History  . Marital Status: Single    Spouse Name: N/A  . Number of Children: N/A  . Years of Education: N/A   Occupational History  . Not on file.   Social History Main Topics  . Smoking status: Never Smoker   . Smokeless tobacco: Never Used  . Alcohol Use: Yes     Comment: occasional  . Drug Use: No  . Sexual Activity: Not on file   Other Topics Concern  . Not on file   Social History Narrative    No current outpatient prescriptions on file prior to visit.   No current facility-administered medications on file prior to visit.    Allergies  Allergen Reactions  . Ceclor [Cefaclor]     Doesn't remember. Childhood reaction  . Erythromycin     Doesn't remember.  Childhood reaction  . Penicillins Hives    Family History  Problem Relation Age of Onset  . Asthma Mother   . Goiter Other   . Other Neg Hx     hyperprolactinemia    BP 124/84 mmHg  Pulse 113  Temp(Src) 98.4 F (36.9 C) (Oral)  Wt 242 lb (109.77 kg)  SpO2 96%  Review of Systems She has slight hirsutism and weight gain.  She denies headache, edema, arthralgias, sob, easy bruising, cold intolerance, neck pain, visual loss, excessive diaphoresis, and dysphagia.  Depression is well-controlled.      Objective:   Physical Exam VS: see vs page GEN: no distress HEAD: head: no deformity eyes: no periorbital swelling, no proptosis external nose and ears are normal mouth: no lesion seen NECK: supple, thyroid is not enlarged CHEST WALL: no deformity LUNGS: clear to auscultation CV: reg rate and rhythm, no murmur ABD: abdomen is soft, nontender.  no hepatosplenomegaly.  not distended.  no hernia.  MUSCULOSKELETAL: muscle bulk and strength are grossly normal.  no obvious joint swelling.  gait is normal and steady EXTEMITIES: no deformity.  no ulcer on the feet.  feet are of normal color and temp.  no edema PULSES: dorsalis pedis intact bilat.  no carotid bruit NEURO:  cn 2-12 grossly  intact.   readily moves all 4's.  sensation is intact to touch on the feet SKIN:  Normal texture and temperature.  No rash or suspicious lesion is visible.   NODES:  None palpable at the neck PSYCH: alert, well-oriented.  Does not appear anxious nor depressed.   outside test results are reviewed: Prolactin=130 TSH=2.1 Testosterone=47 FSH=7   I have reviewed outside records, and summarized: Pt was noted to have prolactin, and referred here.     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  I advised pt not to d/c risperdal--we'll work around this.   Goiter, nonpalpable.  New to me.  We'll recheck [place].    Patient is advised the following: Patient  Instructions  Let's recheck the ultrasound.  you will receive a phone call, about a day and time for an appointment. Please sign a release of information for the 2012 ultrasound in [place].   Also, you should consider having the MRI of the pituitary.  Please think it over, and let me know.  As the Risperdal seems to be working, I hesitate to stop it.  We can suppress the prolactin with another medication. Please come back for a follow-up appointment in 6 months.

## 2015-04-03 NOTE — Patient Instructions (Addendum)
Let's recheck the ultrasound.  you will receive a phone call, about a day and time for an appointment. Please sign a release of information for the 2012 ultrasound in [place].   Also, you should consider having the MRI of the pituitary.  Please think it over, and let me know.  As the Risperdal seems to be working, I hesitate to stop it.  We can suppress the prolactin with another medication. Please come back for a follow-up appointment in 6 months.

## 2015-04-04 ENCOUNTER — Encounter: Payer: Self-pay | Admitting: Endocrinology

## 2015-04-04 DIAGNOSIS — E221 Hyperprolactinemia: Secondary | ICD-10-CM | POA: Insufficient documentation

## 2015-04-05 DIAGNOSIS — F32A Depression, unspecified: Secondary | ICD-10-CM | POA: Insufficient documentation

## 2015-04-05 DIAGNOSIS — F329 Major depressive disorder, single episode, unspecified: Secondary | ICD-10-CM | POA: Insufficient documentation

## 2015-04-16 ENCOUNTER — Ambulatory Visit
Admission: RE | Admit: 2015-04-16 | Discharge: 2015-04-16 | Disposition: A | Discharge status: Home / Self Care | Payer: BLUE CROSS/BLUE SHIELD | Source: Ambulatory Visit | Attending: Endocrinology | Admitting: Endocrinology

## 2015-04-16 DIAGNOSIS — E049 Nontoxic goiter, unspecified: Secondary | ICD-10-CM

## 2015-10-04 ENCOUNTER — Ambulatory Visit: Payer: BLUE CROSS/BLUE SHIELD | Admitting: Endocrinology

## 2017-02-17 IMAGING — US US SOFT TISSUE HEAD/NECK
1 series · 4 of 4 positions shown · non-contrast
Comparison: None.

CLINICAL DATA: 35-year-old female with a history of goiter

EXAM:
THYROID ULTRASOUND
TECHNIQUE: Ultrasound examination of the thyroid gland and adjacent soft
tissues was performed.

[Series 1: us soft tissue head/neck · 0.05mm/px · 4 of 4 slices shown]
[im 1/4]
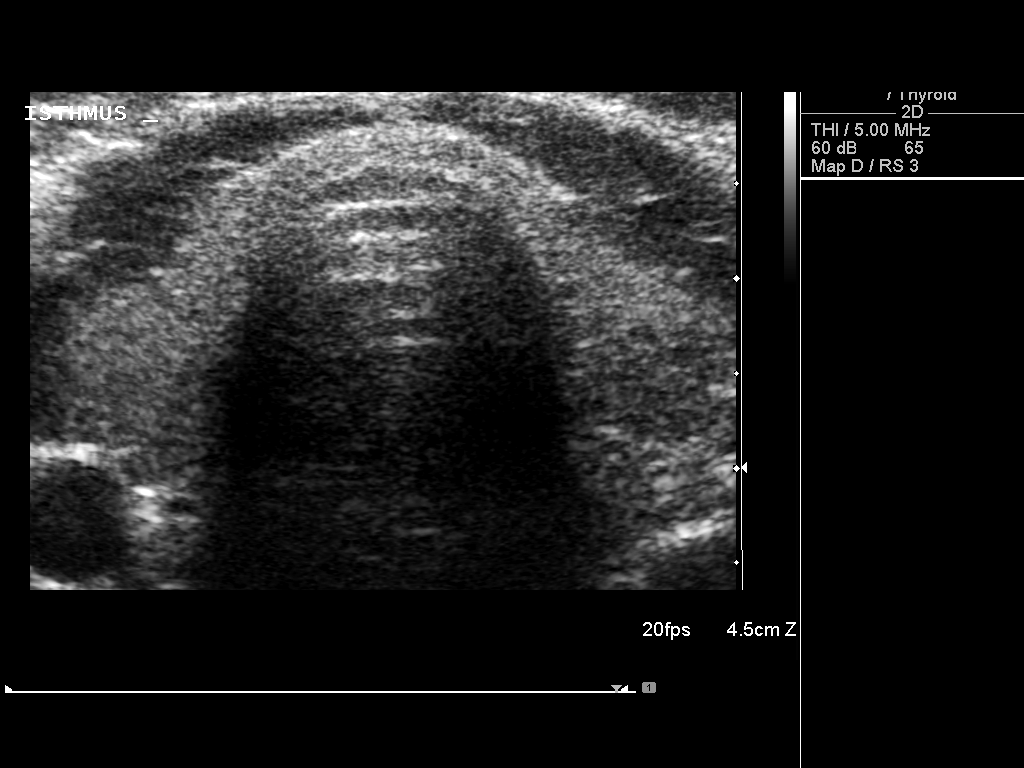
[im 2/4]
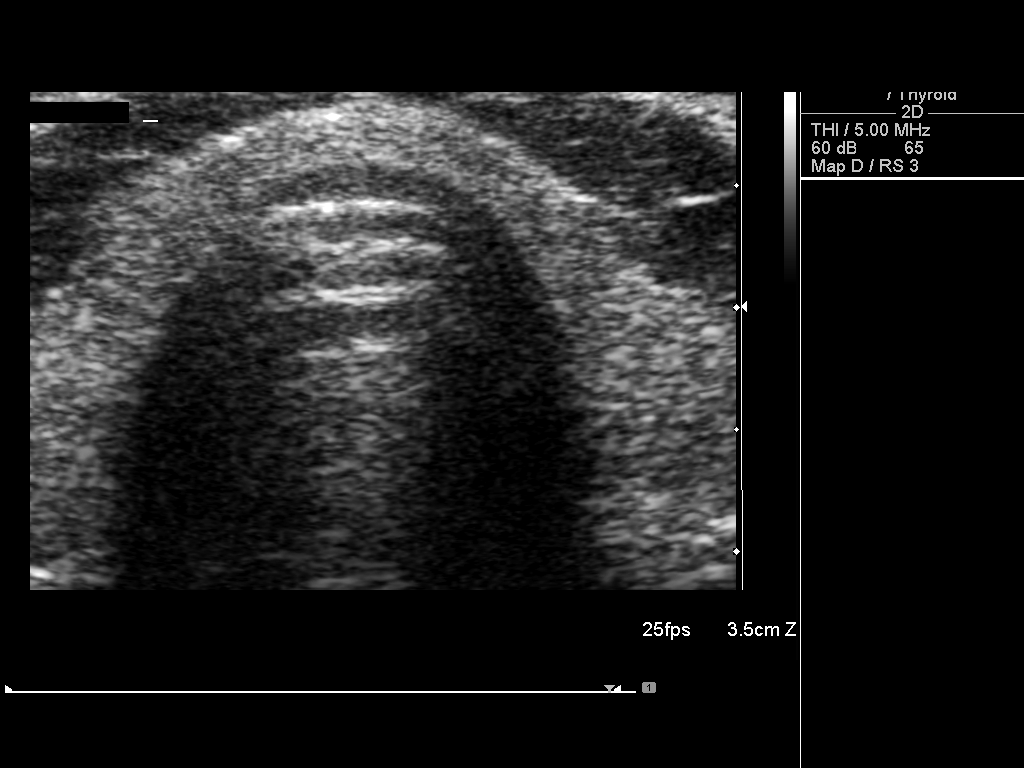
[im 3/4]
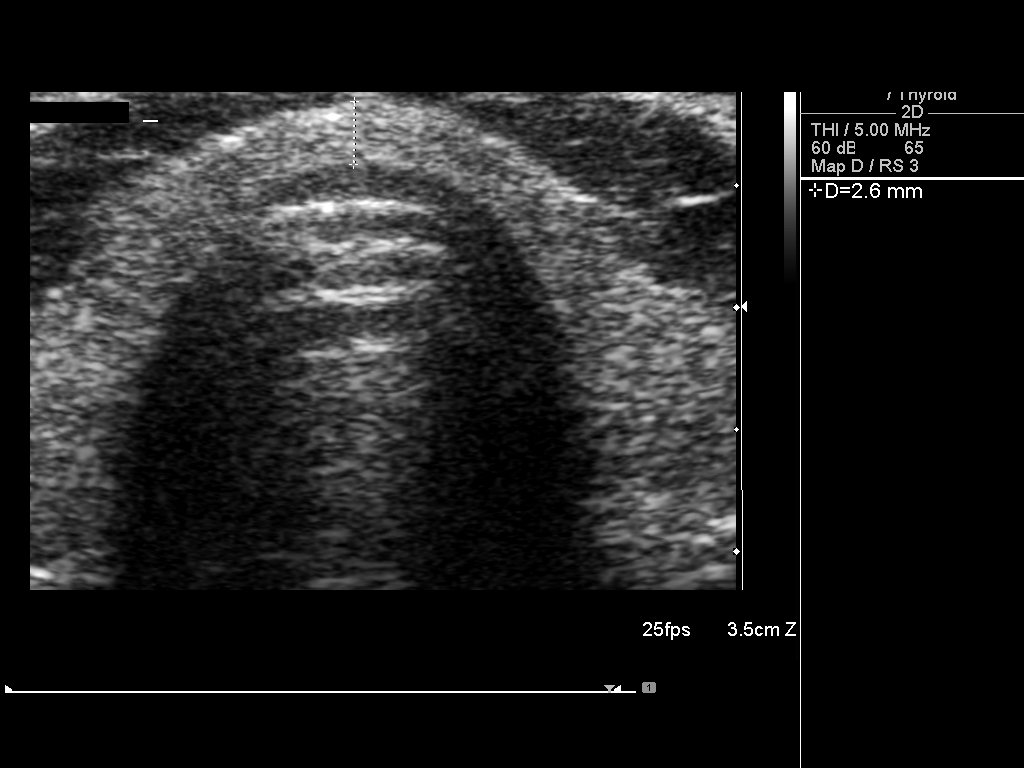
[im 4/4]
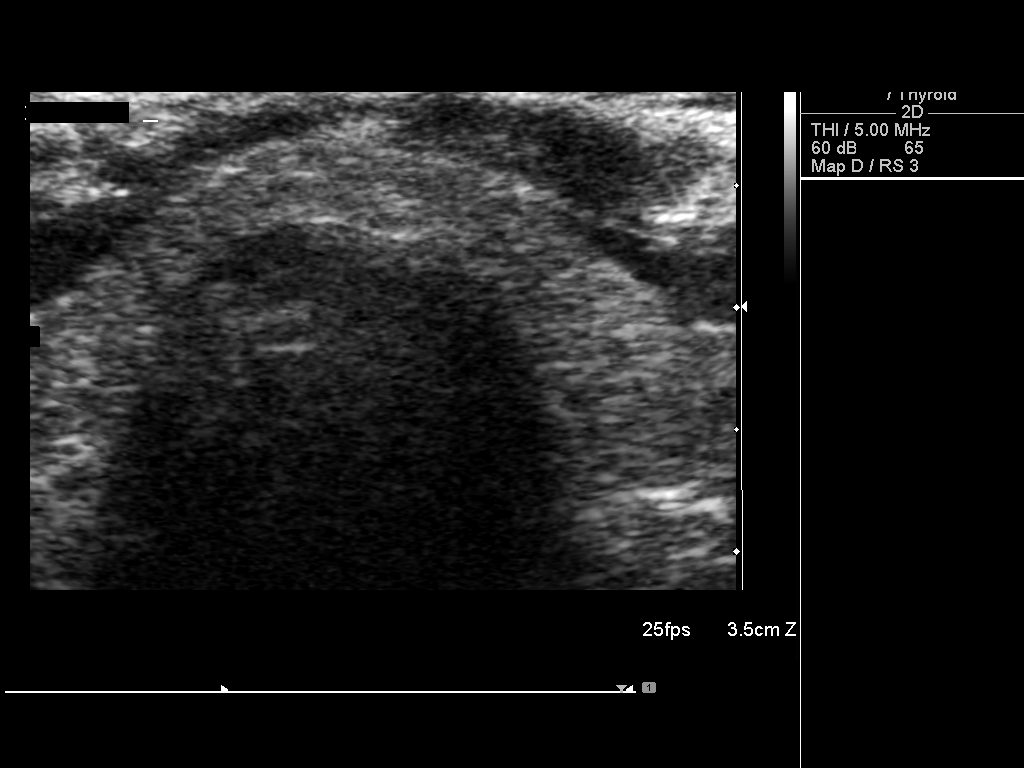

[4 of 4 positions shown; findings below may reference images not displayed]

FINDINGS: Right thyroid lobe

Measurements: 3.7 cm x 1.3 cm x 1.3 cm.  No nodules visualized.

Left thyroid lobe

Measurements: 4.3 cm x 1.1 cm x 1.1 cm.  No nodules visualized.

Isthmus

Thickness: 3 mm.  No nodules visualized.

Lymphadenopathy

None visualized.
IMPRESSION: Relatively unremarkable sonographic survey of the thyroid.

## 2018-02-25 ENCOUNTER — Ambulatory Visit: Payer: Self-pay | Admitting: Mental Health

## 2018-02-25 ENCOUNTER — Ambulatory Visit: Payer: Self-pay | Admitting: Psychiatry

## 2018-03-17 ENCOUNTER — Ambulatory Visit: Payer: Self-pay | Admitting: Psychiatry

## 2018-03-17 ENCOUNTER — Ambulatory Visit: Payer: Self-pay | Admitting: Mental Health

## 2018-03-30 ENCOUNTER — Other Ambulatory Visit: Payer: Self-pay

## 2018-03-30 ENCOUNTER — Encounter: Payer: Self-pay | Admitting: Psychiatry

## 2018-03-30 ENCOUNTER — Ambulatory Visit (INDEPENDENT_AMBULATORY_CARE_PROVIDER_SITE_OTHER): Payer: 59 | Admitting: Psychiatry

## 2018-03-30 ENCOUNTER — Ambulatory Visit: Payer: 59 | Admitting: Mental Health

## 2018-03-30 DIAGNOSIS — F29 Unspecified psychosis not due to a substance or known physiological condition: Secondary | ICD-10-CM

## 2018-03-30 DIAGNOSIS — F3342 Major depressive disorder, recurrent, in full remission: Secondary | ICD-10-CM

## 2018-03-30 DIAGNOSIS — F331 Major depressive disorder, recurrent, moderate: Secondary | ICD-10-CM

## 2018-03-30 MED ORDER — LURASIDONE HCL 80 MG PO TABS: 80.0000 mg | ORAL_TABLET | Freq: Every day | ORAL | 1 refills | Status: DC

## 2018-03-30 MED ORDER — BUPROPION HCL ER (XL) 150 MG PO TB24: 450.0000 mg | ORAL_TABLET | Freq: Every day | ORAL | 1 refills | Status: DC

## 2018-03-30 NOTE — Progress Notes (Signed)
Miranda Norman [id number] 1980-02-15 38 y.o.  Subjective:   Patient ID:  Miranda Norman is a 38 y.o. (DOB [birth year]) female.  Chief Complaint:  Chief Complaint  Patient presents with  . Follow-up    med mangement    HPI Miranda Norman presents to the office today for follow-up of a psychotic disorder, NOS and major depression.  Last seen September 2019.  No med changes were made and she was doing well on her medications.   Hiking and lost 15 #.  Mood pretty good and has been since here.  Working from home and is good.  Patient reports stable mood and denies depressed or irritable moods.  Patient denies any recent difficulty with anxiety.  Patient denies difficulty with sleep initiation or maintenance. Denies appetite disturbance.  Patient reports that energy and motivation have been good.  Patient denies any difficulty with concentration.  Patient denies any suicidal ideation.  Past Psychiatric Medication Trials: Latuda 80, Wellbutrin 450, Lexapro 7.5, risperidone 2 mg twice daily, Abilify, sertraline, lorazepam   Review of Systems:  Review of Systems  Musculoskeletal: Negative for gait problem.  Neurological: Negative for tremors.  Psychiatric/Behavioral:       Please refer to HPI    Medications: I have reviewed the patient's current medications.  Current Outpatient Medications  Medication Sig Dispense Refill  . buPROPion (WELLBUTRIN XL) 150 MG 24 hr tablet Take 3 tablets (450 mg total) by mouth daily. 270 tablet 1  . lurasidone (LATUDA) 80 MG TABS tablet Take 1 tablet (80 mg total) by mouth daily with breakfast. 90 tablet 1   No current facility-administered medications for this visit.     Medication Side Effects: None  Allergies:  Allergies  Allergen Reactions  . Ceclor [Cefaclor]     Doesn't remember. Childhood reaction  . Erythromycin     Doesn't remember. Childhood reaction  . Penicillins Hives    Past Medical History:  Diagnosis Date  . CMV  (cytomegalovirus) (HCC) 2006  . Hyperprolactinemia (HCC)   . Thyroid goiter 4 years ago   "everything is normal"    Family History  Problem Relation Age of Onset  . Asthma Mother   . Goiter Other   . Other Neg Hx        hyperprolactinemia    Social History   Socioeconomic History  . Marital status: Single    Spouse name: Not on file  . Number of children: Not on file  . Years of education: Not on file  . Highest education level: Not on file  Occupational History  . Not on file  Social Needs  . Financial resource strain: Not on file  . Food insecurity:    Worry: Not on file    Inability: Not on file  . Transportation needs:    Medical: Not on file    Non-medical: Not on file  Tobacco Use  . Smoking status: Never Smoker  . Smokeless tobacco: Never Used  Substance and Sexual Activity  . Alcohol use: Yes    Comment: occasional  . Drug use: No  . Sexual activity: Not on file  Lifestyle  . Physical activity:    Days per week: Not on file    Minutes per session: Not on file  . Stress: Not on file  Relationships  . Social connections:    Talks on phone: Not on file    Gets together: Not on file    Attends religious service: Not on file  Active member of club or organization: Not on file    Attends meetings of clubs or organizations: Not on file    Relationship status: Not on file  . Intimate partner violence:    Fear of current or ex partner: Not on file    Emotionally abused: Not on file    Physically abused: Not on file    Forced sexual activity: Not on file  Other Topics Concern  . Not on file  Social History Narrative  . Not on file    Past Medical History, Surgical history, Social history, and Family history were reviewed and updated as appropriate.   Please see review of systems for further details on the patient's review from today.   Objective:   Physical Exam:  There were no vitals taken for this visit.  Physical Exam Constitutional:       General: She is not in acute distress.    Appearance: She is well-developed.  Musculoskeletal:        General: No deformity.  Neurological:     Mental Status: She is alert and oriented to person, place, and time.     Cranial Nerves: No dysarthria.     Coordination: Coordination normal.  Psychiatric:        Attention and Perception: She is attentive. She does not perceive auditory hallucinations.        Mood and Affect: Mood is not anxious or depressed. Affect is not labile, blunt, angry or inappropriate.        Speech: Speech normal. Speech is not slurred.        Behavior: Behavior normal.        Thought Content: Thought content normal. Thought content does not include homicidal or suicidal ideation.        Cognition and Memory: Cognition normal.        Judgment: Judgment normal.     Comments: Insight intact. No auditory or visual hallucinations. No delusions.      Lab Review:     Component Value Date/Time   NA 138 04/04/2013 0915   K 4.3 04/04/2013 0915   CL 101 04/04/2013 0915   CO2 25 04/04/2013 0915   GLUCOSE 94 04/04/2013 0915   BUN 10 04/04/2013 0915   CREATININE 0.67 04/04/2013 0915   CALCIUM 9.3 04/04/2013 0915   PROT 6.9 04/04/2013 0915   ALBUMIN 3.8 04/04/2013 0915   AST 27 04/04/2013 0915   ALT 20 04/04/2013 0915   ALKPHOS 54 04/04/2013 0915   BILITOT 0.4 04/04/2013 0915   GFRNONAA >90 04/04/2013 0915   GFRAA >90 04/04/2013 0915       Component Value Date/Time   WBC 7.5 04/04/2013 0915   RBC 5.07 04/04/2013 0915   HGB 14.9 04/04/2013 0915   HCT 42.9 04/04/2013 0915   PLT PLATELET CLUMPS NOTED ON SMEAR, UNABLE TO ESTIMATE 04/04/2013 0915   MCV 84.6 04/04/2013 0915   MCH 29.4 04/04/2013 0915   MCHC 34.7 04/04/2013 0915   RDW 13.0 04/04/2013 0915   LYMPHSABS 1.8 04/04/2013 0915   MONOABS 0.4 04/04/2013 0915   EOSABS 0.2 04/04/2013 0915   BASOSABS 0.0 04/04/2013 0915    No results found for: POCLITH, LITHIUM   No results found for: PHENYTOIN,  PHENOBARB, VALPROATE, CBMZ   .res Assessment: Plan:    Psychosis, unspecified psychosis type (HCC)  Recurrent major depression in full remission (HCC)   No questions concerns about the meds.  Pleased.  No med changes made.  Doing well.  High  relapse risk without  Meds.  FU  6 mos  I connected with patient by a video enabled telemedicine application or telephone, with their informed consent, and verified patient privacy and that I am speaking with the correct person using two identifiers.  I was located work and patient home.   Meredith Staggers, MD, DFAPA   Please see After Visit Summary for patient specific instructions.  No future appointments.  No orders of the defined types were placed in this encounter.     -------------------------------

## 2018-03-30 NOTE — Progress Notes (Signed)
[facility] Counselor/Therapist Progress Note  Patient ID: Miranda Norman, MRN: [id number],    Date: 03/30/2018  Time Spent: 45 minutes  Telephone session from therapist's home to patient's home with consent due to [place] Pandemic.   Treatment Type: Individual Therapy  Reported Symptoms: euthymic mood, stable, minimal anxiety due to having to work from home. Normal stress due to situation. Sleeping well.  Mental Status Exam:  Appearance:   unseen- telephone session     Behavior:  Appropriate, Sharing and Motivated  Motor:  unseen - telephone session  Speech/Language:   Clear and Coherent and Normal Rate  Affect:  Full Range  Mood:  euthymic  Thought process:  normal  Thought content:    WNL  Sensory/Perceptual disturbances:    WNL  Orientation:  oriented to person, place and time/date  Attention:  Good  Concentration:  Good  Memory:  WNL  Fund of knowledge:   Good  Insight:    Good  Judgment:   Good  Impulse Control:  Good   Risk Assessment: Danger to Self:  No Self-injurious Behavior: No Danger to Others: No Duty to Warn:no Physical Aggression / Violence:No  Access to Firearms a concern: No  Gang Involvement:No   Subjective: Able to enjoy getting out and going on hikes, kayacking. With friends. Working from home. 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   Interventions: Solution-Oriented/Positive Psychology, Interpersonal and supportive  Diagnosis: F33.1        I connected with patient from my home by a video enabled telemedicine application or telephone,with their informed consent.  Patient at home.    Treatment Plan   Patient Name: Miranda Norman   Date: March 30, 2018   Didactic topic to be discussed:           Anxiety:                   Locus of control                              Work/Life balance           Depression                             Problem-solving                              Relationships                       Boundaries                                     Coping srategies                             Communication                    Recovery from trauma                    Self-care  Validation  Other     Goals:  Patient  1. Maintains mood stabiity:  decreased symptoms of     depression     anxiety  2.   Practices pro-active self-care:   restful sleep, nutrition, exercise, socialization  3.   Effective utilizes boundaries and sets limits  4.   Utliizes coping strategies and problem solving techniques for stress management  5.   Feels accurately heard, understood and validated  Other      Tenny Craw [facility]     Ulice Bold, [facility]

## 2018-10-18 ENCOUNTER — Ambulatory Visit: Payer: 59 | Admitting: Mental Health

## 2018-10-18 ENCOUNTER — Other Ambulatory Visit: Payer: Self-pay

## 2018-10-18 ENCOUNTER — Ambulatory Visit (INDEPENDENT_AMBULATORY_CARE_PROVIDER_SITE_OTHER): Payer: 59 | Admitting: Psychiatry

## 2018-10-18 ENCOUNTER — Encounter: Payer: Self-pay | Admitting: Psychiatry

## 2018-10-18 ENCOUNTER — Encounter (INDEPENDENT_AMBULATORY_CARE_PROVIDER_SITE_OTHER): Payer: Self-pay

## 2018-10-18 DIAGNOSIS — F29 Unspecified psychosis not due to a substance or known physiological condition: Secondary | ICD-10-CM | POA: Diagnosis not present

## 2018-10-18 DIAGNOSIS — F3342 Major depressive disorder, recurrent, in full remission: Secondary | ICD-10-CM

## 2018-10-18 MED ORDER — LURASIDONE HCL 80 MG PO TABS: 80.0000 mg | ORAL_TABLET | Freq: Every day | ORAL | 6 refills | Status: DC

## 2018-10-18 MED ORDER — BUPROPION HCL ER (XL) 150 MG PO TB24: 450.0000 mg | ORAL_TABLET | Freq: Every day | ORAL | 1 refills | Status: DC

## 2018-10-18 NOTE — Progress Notes (Signed)
Miranda Norman [id number] 1980-11-14 38 y.o.  Subjective:   Patient ID:  Miranda Norman is a 38 y.o. (DOB [birth year]) female.  Chief Complaint:  Chief Complaint  Patient presents with  . Follow-up    Medication Management  . Depression    Medication Management    HPI Miranda Norman presents to the office today for follow-up of a psychotic disorder, NOS and major depression.  Last seen March 30, 2018.  No med changes were made and she was doing well on her medications. Remained on Wellbutrin 450 and Latuda 80.  Has remained well.   Mood stable.  Anxiety under control.  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  [employer] work continues.  Still hikes and kayaks.  Great group of friends she does this with.  Started dating a little.  Asked about BCP.     Mood pretty good and has been since here.  Working from home and is good.  Patient reports stable mood and denies depressed or irritable moods.  No mania nor depression episodes.  Patient denies any recent difficulty with anxiety.  Patient denies difficulty with sleep initiation or maintenance. Denies appetite disturbance.  Patient reports that energy and motivation have been good.  Patient denies any difficulty with concentration.  Patient denies any suicidal ideation.  Past Psychiatric Medication Trials: Latuda 80- 2017, risperidone 2 mg twice daily high prolactin, Abilify failed transition from risperidone  Wellbutrin 450, Lexapro 7.5, sertraline, lorazepam History of psychiatric hospitalization.  Review of Systems:  Review of Systems  Musculoskeletal: Negative for gait problem.  Neurological: Negative for tremors and weakness.  Psychiatric/Behavioral: Positive for depression.       Please refer to HPI    Medications: I have reviewed the patient's current medications.  Current Outpatient Medications  Medication Sig Dispense Refill  . buPROPion (WELLBUTRIN XL) 150 MG 24 hr tablet Take 3 tablets (450 mg  total) by mouth daily. 270 tablet 1  . lurasidone (LATUDA) 80 MG TABS tablet Take 1 tablet (80 mg total) by mouth daily with breakfast. 30 tablet 6   No current facility-administered medications for this visit.     Medication Side Effects: None  Allergies:  Allergies  Allergen Reactions  . Ceclor [Cefaclor]     Doesn't remember. Childhood reaction  . Erythromycin     Doesn't remember. Childhood reaction  . Penicillins Hives    Past Medical History:  Diagnosis Date  . CMV (cytomegalovirus) (HCC) 2006  . Hyperprolactinemia (HCC)   . Thyroid goiter 4 years ago   "everything is normal"    Family History  Problem Relation Age of Onset  . Asthma Mother   . Goiter Other   . Other Neg Hx        hyperprolactinemia    Social History   Socioeconomic History  . Marital status: Single    Spouse name: Not on file  . Number of children: Not on file  . Years of education: Not on file  . Highest education level: Not on file  Occupational History  . Not on file  Social Needs  . Financial resource strain: Not on file  . Food insecurity    Worry: Not on file    Inability: Not on file  . Transportation needs    Medical: Not on file    Non-medical: Not on file  Tobacco Use  . Smoking status: Never Smoker  . Smokeless tobacco: Never Used  Substance and Sexual Activity  .  Alcohol use: Yes    Comment: occasional  . Drug use: No  . Sexual activity: Not on file  Lifestyle  . Physical activity    Days per week: Not on file    Minutes per session: Not on file  . Stress: Not on file  Relationships  . Social Herbalist on phone: Not on file    Gets together: Not on file    Attends religious service: Not on file    Active member of club or organization: Not on file    Attends meetings of clubs or organizations: Not on file    Relationship status: Not on file  . Intimate partner violence    Fear of current or ex partner: Not on file    Emotionally abused: Not on  file    Physically abused: Not on file    Forced sexual activity: Not on file  Other Topics Concern  . Not on file  Social History Narrative  . Not on file    Past Medical History, Surgical history, Social history, and Family history were reviewed and updated as appropriate.   Please see review of systems for further details on the patient's review from today.   Objective:   Physical Exam:  There were no vitals taken for this visit.  Physical Exam Constitutional:      General: She is not in acute distress.    Appearance: She is well-developed.  Musculoskeletal:        General: No deformity.  Neurological:     Mental Status: She is alert and oriented to person, place, and time.     Cranial Nerves: No dysarthria.     Coordination: Coordination normal.  Psychiatric:        Attention and Perception: She is attentive. She does not perceive auditory hallucinations.        Mood and Affect: Mood is not anxious or depressed. Affect is not labile, blunt, angry or inappropriate.        Speech: Speech normal. Speech is not slurred.        Behavior: Behavior normal.        Thought Content: Thought content normal. Thought content does not include homicidal or suicidal ideation.        Cognition and Memory: Cognition normal.        Judgment: Judgment normal.     Comments: Insight intact. No auditory or visual hallucinations. No delusions.      Lab Review:     Component Value Date/Time   NA 138 04/04/2013 0915   K 4.3 04/04/2013 0915   CL 101 04/04/2013 0915   CO2 25 04/04/2013 0915   GLUCOSE 94 04/04/2013 0915   BUN 10 04/04/2013 0915   CREATININE 0.67 04/04/2013 0915   CALCIUM 9.3 04/04/2013 0915   PROT 6.9 04/04/2013 0915   ALBUMIN 3.8 04/04/2013 0915   AST 27 04/04/2013 0915   ALT 20 04/04/2013 0915   ALKPHOS 54 04/04/2013 0915   BILITOT 0.4 04/04/2013 0915   GFRNONAA >90 04/04/2013 0915   GFRAA >90 04/04/2013 0915       Component Value Date/Time   WBC 7.5  04/04/2013 0915   RBC 5.07 04/04/2013 0915   HGB 14.9 04/04/2013 0915   HCT 42.9 04/04/2013 0915   PLT PLATELET CLUMPS NOTED ON SMEAR, UNABLE TO ESTIMATE 04/04/2013 0915   MCV 84.6 04/04/2013 0915   MCH 29.4 04/04/2013 0915   MCHC 34.7 04/04/2013 0915   RDW 13.0 04/04/2013  0915   LYMPHSABS 1.8 04/04/2013 0915   MONOABS 0.4 04/04/2013 0915   EOSABS 0.2 04/04/2013 0915   BASOSABS 0.0 04/04/2013 0915    No results found for: POCLITH, LITHIUM   No results found for: PHENYTOIN, PHENOBARB, VALPROATE, CBMZ   .res Assessment: Plan:    Recurrent major depression in full remission (HCC) - Plan: buPROPion (WELLBUTRIN XL) 150 MG 24 hr tablet, lurasidone (LATUDA) 80 MG TABS tablet  Psychosis, unspecified psychosis type (HCC) - Plan: lurasidone (LATUDA) 80 MG TABS tablet   No questions concerns about the meds.  Pleased.  No med changes made.  Doing well.  High relapse risk without  Meds.  Disc prior relapse with attempted transition from risperidone to Abilify.  Discussed potential metabolic side effects associated with atypical antipsychotics, as well as potential risk for movement side effects. Advised pt to contact office if movement side effects occur.  No evidence of TD  FU  6 mos  Miranda Staggersarey Cottle, MD, DFAPA   Please see After Visit Summary for patient specific instructions.  No future appointments.  No orders of the defined types were placed in this encounter.     -------------------------------

## 2019-04-19 ENCOUNTER — Ambulatory Visit (INDEPENDENT_AMBULATORY_CARE_PROVIDER_SITE_OTHER): Payer: 59 | Admitting: Psychiatry

## 2019-04-19 ENCOUNTER — Encounter: Payer: Self-pay | Admitting: Psychiatry

## 2019-04-19 ENCOUNTER — Other Ambulatory Visit: Payer: Self-pay

## 2019-04-19 DIAGNOSIS — Z8659 Personal history of other mental and behavioral disorders: Secondary | ICD-10-CM

## 2019-04-19 DIAGNOSIS — F29 Unspecified psychosis not due to a substance or known physiological condition: Secondary | ICD-10-CM | POA: Diagnosis not present

## 2019-04-19 DIAGNOSIS — F3342 Major depressive disorder, recurrent, in full remission: Secondary | ICD-10-CM | POA: Diagnosis not present

## 2019-04-19 MED ORDER — LURASIDONE HCL 80 MG PO TABS: 80.0000 mg | ORAL_TABLET | Freq: Every day | ORAL | 6 refills | Status: DC

## 2019-04-19 MED ORDER — BUPROPION HCL ER (XL) 150 MG PO TB24: 450.0000 mg | ORAL_TABLET | Freq: Every day | ORAL | 1 refills | Status: DC

## 2019-04-19 NOTE — Progress Notes (Signed)
Miranda Norman [id number] [birth year] 39 y.o.  Subjective:   Patient ID:  Miranda Norman is a 39 y.o. (DOB August 27, 1980) female.  Chief Complaint:  Chief Complaint  Patient presents with  . Follow-up    Depression        Miranda Norman presents to the office today for follow-up of a psychotic disorder, NOS and major depression.  Last seen October , 2020.  No med changes were made and she was doing well on her medications. Remained on Wellbutrin 450 and Latuda 80.  Has remained well.   Consistent with meds.  No SE.  Mood stable.  Anxiety under control.  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  [employer] work continues.  Still hikes and kayaks.  Great group of friends she does this with.  Covid vaccinated.  Some anxiety and conflict over faith and prior psychosis.     Mood pretty good and has been since here.  Working from home and office but travels.  Patient reports stable mood and denies depressed or irritable moods.  No mania nor depression episodes.  Patient has occassional anxiety example irrational fear of being alone in the woods. Still did a trail run.  Patient denies difficulty with sleep initiation or maintenance. Denies appetite disturbance.  Patient reports that energy and motivation have been good.  Patient denies any difficulty with concentration.  Patient denies any suicidal ideation.  Past Psychiatric Medication Trials: Latuda 80- 2017, risperidone 2 mg twice daily high prolactin, Abilify failed transition from risperidone  Wellbutrin 450, Lexapro 7.5, sertraline, lorazepam History of psychiatric hospitalization.  Review of Systems:  Review of Systems  Musculoskeletal: Negative for gait problem.  Neurological: Negative for dizziness, tremors and weakness.  Psychiatric/Behavioral: Positive for depression.       Please refer to HPI    Medications: I have reviewed the patient's current medications.  Current Outpatient Medications  Medication  Sig Dispense Refill  . buPROPion (WELLBUTRIN XL) 150 MG 24 hr tablet Take 3 tablets (450 mg total) by mouth daily. 270 tablet 1  . lurasidone (LATUDA) 80 MG TABS tablet Take 1 tablet (80 mg total) by mouth daily with breakfast. 30 tablet 6   No current facility-administered medications for this visit.    Medication Side Effects: None  Allergies:  Allergies  Allergen Reactions  . Ceclor [Cefaclor]     Doesn't remember. Childhood reaction  . Erythromycin     Doesn't remember. Childhood reaction  . Penicillins Hives    Past Medical History:  Diagnosis Date  . CMV (cytomegalovirus) (HCC) 2006  . Hyperprolactinemia (HCC)   . Thyroid goiter 4 years ago   "everything is normal"    Family History  Problem Relation Age of Onset  . Asthma Mother   . Goiter Other   . Other Neg Hx        hyperprolactinemia    Social History   Socioeconomic History  . Marital status: Single    Spouse name: Not on file  . Number of children: Not on file  . Years of education: Not on file  . Highest education level: Not on file  Occupational History  . Not on file  Tobacco Use  . Smoking status: Never Smoker  . Smokeless tobacco: Never Used  Substance and Sexual Activity  . Alcohol use: Yes    Comment: occasional  . Drug use: No  . Sexual activity: Not on file  Other Topics Concern  . Not on file  Social History Narrative  . Not on file   Social Determinants of Health   Financial Resource Strain:   . Difficulty of Paying Living Expenses:   Food Insecurity:   . Worried About Charity fundraiser in the Last Year:   . Arboriculturist in the Last Year:   Transportation Needs:   . Film/video editor (Medical):   Marland Kitchen Lack of Transportation (Non-Medical):   Physical Activity:   . Days of Exercise per Week:   . Minutes of Exercise per Session:   Stress:   . Feeling of Stress :   Social Connections:   . Frequency of Communication with Friends and Family:   . Frequency of Social  Gatherings with Friends and Family:   . Attends Religious Services:   . Active Member of Clubs or Organizations:   . Attends Archivist Meetings:   Marland Kitchen Marital Status:   Intimate Partner Violence:   . Fear of Current or Ex-Partner:   . Emotionally Abused:   Marland Kitchen Physically Abused:   . Sexually Abused:     Past Medical History, Surgical history, Social history, and Family history were reviewed and updated as appropriate.   Please see review of systems for further details on the patient's review from today.   Objective:   Physical Exam:  There were no vitals taken for this visit.  Physical Exam Constitutional:      General: She is not in acute distress.    Appearance: She is well-developed.  Musculoskeletal:        General: No deformity.  Neurological:     Mental Status: She is alert and oriented to person, place, and time.     Cranial Nerves: No dysarthria.     Coordination: Coordination normal.  Psychiatric:        Attention and Perception: She is attentive. She does not perceive auditory hallucinations.        Mood and Affect: Mood is not anxious or depressed. Affect is not labile, blunt, angry or inappropriate.        Speech: Speech normal. Speech is not slurred.        Behavior: Behavior normal. Behavior is not slowed.        Thought Content: Thought content normal. Thought content does not include homicidal or suicidal ideation.        Cognition and Memory: Cognition normal.        Judgment: Judgment normal.     Comments: Insight intact. No auditory or visual hallucinations. No delusions.      Lab Review:     Component Value Date/Time   NA 138 04/04/2013 0915   K 4.3 04/04/2013 0915   CL 101 04/04/2013 0915   CO2 25 04/04/2013 0915   GLUCOSE 94 04/04/2013 0915   BUN 10 04/04/2013 0915   CREATININE 0.67 04/04/2013 0915   CALCIUM 9.3 04/04/2013 0915   PROT 6.9 04/04/2013 0915   ALBUMIN 3.8 04/04/2013 0915   AST 27 04/04/2013 0915   ALT 20 04/04/2013  0915   ALKPHOS 54 04/04/2013 0915   BILITOT 0.4 04/04/2013 0915   GFRNONAA >90 04/04/2013 0915   GFRAA >90 04/04/2013 0915       Component Value Date/Time   WBC 7.5 04/04/2013 0915   RBC 5.07 04/04/2013 0915   HGB 14.9 04/04/2013 0915   HCT 42.9 04/04/2013 0915   PLT PLATELET CLUMPS NOTED ON SMEAR, UNABLE TO ESTIMATE 04/04/2013 0915   MCV 84.6 04/04/2013 0915  MCH 29.4 04/04/2013 0915   MCHC 34.7 04/04/2013 0915   RDW 13.0 04/04/2013 0915   LYMPHSABS 1.8 04/04/2013 0915   MONOABS 0.4 04/04/2013 0915   EOSABS 0.2 04/04/2013 0915   BASOSABS 0.0 04/04/2013 0915    No results found for: POCLITH, LITHIUM   No results found for: PHENYTOIN, PHENOBARB, VALPROATE, CBMZ   .res Assessment: Plan:    Recurrent major depression in full remission (HCC) - Plan: buPROPion (WELLBUTRIN XL) 150 MG 24 hr tablet, lurasidone (LATUDA) 80 MG TABS tablet  History of psychosis  Psychosis, unspecified psychosis type (HCC) - Plan: lurasidone (LATUDA) 80 MG TABS tablet   No questions concerns about the meds.  Pleased.  No med changes made.  Disc spiritual guilt over prior psychotic episodes and support and education given.  Doing well.  High relapse risk without  Meds.  Disc prior relapse with attempted transition from risperidone to Abilify. No psychosis.  Discussed potential metabolic side effects associated with atypical antipsychotics, as well as potential risk for movement side effects. Advised pt to contact office if movement side effects occur.  No evidence of TD  FU  6 mos  Meredith Staggers, MD, DFAPA   Please see After Visit Summary for patient specific instructions.  No future appointments.  No orders of the defined types were placed in this encounter.     -------------------------------

## 2019-06-09 ENCOUNTER — Telehealth: Payer: Self-pay | Admitting: Psychiatry

## 2019-06-09 NOTE — Telephone Encounter (Signed)
Pt stated she is currently taking Latuda with directions to eat 350 calories. She stated Tues and Wed she did not meet the recommended calorie intake. Please advise if this is not met due to stomach issues or other circumstances.

## 2019-06-12 NOTE — Telephone Encounter (Signed)
Patient returned your call said she didn't realize that you called from a blocked number so she said if you call her back she will answer

## 2019-06-12 NOTE — Telephone Encounter (Signed)
It is not a problem if she does not meet the calorie goal average once a week.  If she is missing the calorie goal 3 times a week or more than it could cause her to have more psychotic symptoms because the absorption of Latuda is about 30% lower without food then it is with food.

## 2019-06-12 NOTE — Telephone Encounter (Signed)
LM to call back with update on how she's doing with her medication.

## 2019-06-13 NOTE — Telephone Encounter (Signed)
LM to call back and discuss information 

## 2019-06-16 NOTE — Telephone Encounter (Signed)
Given information, she was just concerned on what she would do if that happen, such as if she got sick for a week and couldn't keep food down. Advised her to definitely call if anything like that happens.

## 2019-10-18 ENCOUNTER — Other Ambulatory Visit: Payer: Self-pay

## 2019-10-18 ENCOUNTER — Encounter: Payer: Self-pay | Admitting: Psychiatry

## 2019-10-18 ENCOUNTER — Ambulatory Visit (INDEPENDENT_AMBULATORY_CARE_PROVIDER_SITE_OTHER): Payer: 59 | Admitting: Psychiatry

## 2019-10-18 DIAGNOSIS — Z8659 Personal history of other mental and behavioral disorders: Secondary | ICD-10-CM | POA: Diagnosis not present

## 2019-10-18 DIAGNOSIS — F3342 Major depressive disorder, recurrent, in full remission: Secondary | ICD-10-CM | POA: Diagnosis not present

## 2019-10-18 DIAGNOSIS — F29 Unspecified psychosis not due to a substance or known physiological condition: Secondary | ICD-10-CM | POA: Diagnosis not present

## 2019-10-18 MED ORDER — BUPROPION HCL ER (XL) 150 MG PO TB24: 450.0000 mg | ORAL_TABLET | Freq: Every day | ORAL | 1 refills | Status: DC

## 2019-10-18 MED ORDER — LURASIDONE HCL 80 MG PO TABS: 80.0000 mg | ORAL_TABLET | Freq: Every day | ORAL | 1 refills | Status: DC

## 2019-10-18 NOTE — Progress Notes (Signed)
Miranda Norman [id number] May 06, 1980 39 y.o.  Subjective:   Patient ID:  Miranda Norman is a 39 y.o. (DOB [birth year]) female.  Chief Complaint:  Chief Complaint  Patient presents with  . Follow-up    Depression        Miranda Norman presents to the office today for follow-up of a psychotic disorder, NOS and major depression.  Last seen6 mos ago..  No med changes were made and she was doing well on her medications. Remained on Wellbutrin 450 and Latuda 80.  Has remained well. No new complaints.  Consistent with meds.  No SE.  Has questions of what happens if circumstances prevent enough calories with med and how to compensate.  Mood stable.  Anxiety under control.  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  [employer] work continues.  Still hikes and kayaks.  Great group of friends she does this with.  Covid vaccinated.  Some anxiety and conflict over faith and prior psychosis.     Mood pretty good and has been since here.  Working from home and office but travels.  Patient reports stable mood and denies depressed or irritable moods.  No mania nor depression episodes.  Patient has occassional anxiety example irrational fear of being alone in the woods. Still did a trail run.  Patient denies difficulty with sleep initiation or maintenance. Denies appetite disturbance.  Patient reports that energy and motivation have been good.  Patient denies any difficulty with concentration.  Patient denies any suicidal ideation.  Past Psychiatric Medication Trials: Latuda 80- 2017, risperidone 2 mg twice daily high prolactin, Abilify failed transition from risperidone  Wellbutrin 450, Lexapro 7.5, sertraline, lorazepam History of psychiatric hospitalization.  Review of Systems:  Review of Systems  Cardiovascular: Negative for palpitations.  Musculoskeletal: Negative for gait problem.  Neurological: Negative for dizziness, tremors and weakness.  Psychiatric/Behavioral:  Positive for depression.       Please refer to HPI    Medications: I have reviewed the patient's current medications.  Current Outpatient Medications  Medication Sig Dispense Refill  . Norethin-Eth Estrad-Fe Biphas (LO LOESTRIN FE PO) Take by mouth.    Marland Kitchen buPROPion (WELLBUTRIN XL) 150 MG 24 hr tablet Take 3 tablets (450 mg total) by mouth daily. 270 tablet 1  . lurasidone (LATUDA) 80 MG TABS tablet Take 1 tablet (80 mg total) by mouth daily with breakfast. 90 tablet 1   No current facility-administered medications for this visit.    Medication Side Effects: None  Allergies:  Allergies  Allergen Reactions  . Ceclor [Cefaclor]     Doesn't remember. Childhood reaction  . Erythromycin     Doesn't remember. Childhood reaction  . Penicillins Hives    Past Medical History:  Diagnosis Date  . CMV (cytomegalovirus) (HCC) 2006  . Hyperprolactinemia (HCC)   . Thyroid goiter 4 years ago   "everything is normal"    Family History  Problem Relation Age of Onset  . Asthma Mother   . Goiter Other   . Other Neg Hx        hyperprolactinemia    Social History   Socioeconomic History  . Marital status: Single    Spouse name: Not on file  . Number of children: Not on file  . Years of education: Not on file  . Highest education level: Not on file  Occupational History  . Not on file  Tobacco Use  . Smoking status: Never Smoker  . Smokeless tobacco: Never  Used  Substance and Sexual Activity  . Alcohol use: Yes    Comment: occasional  . Drug use: No  . Sexual activity: Not on file  Other Topics Concern  . Not on file  Social History Narrative  . Not on file   Social Determinants of Health   Financial Resource Strain:   . Difficulty of Paying Living Expenses: Not on file  Food Insecurity:   . Worried About Programme researcher, broadcasting/film/video in the Last Year: Not on file  . Ran Out of Food in the Last Year: Not on file  Transportation Needs:   . Lack of Transportation (Medical): Not  on file  . Lack of Transportation (Non-Medical): Not on file  Physical Activity:   . Days of Exercise per Week: Not on file  . Minutes of Exercise per Session: Not on file  Stress:   . Feeling of Stress : Not on file  Social Connections:   . Frequency of Communication with Friends and Family: Not on file  . Frequency of Social Gatherings with Friends and Family: Not on file  . Attends Religious Services: Not on file  . Active Member of Clubs or Organizations: Not on file  . Attends Banker Meetings: Not on file  . Marital Status: Not on file  Intimate Partner Violence:   . Fear of Current or Ex-Partner: Not on file  . Emotionally Abused: Not on file  . Physically Abused: Not on file  . Sexually Abused: Not on file    Past Medical History, Surgical history, Social history, and Family history were reviewed and updated as appropriate.   Please see review of systems for further details on the patient's review from today.   Objective:   Physical Exam:  There were no vitals taken for this visit.  Physical Exam Constitutional:      General: She is not in acute distress.    Appearance: She is well-developed.  Musculoskeletal:        General: No deformity.  Neurological:     Mental Status: She is alert and oriented to person, place, and time.     Cranial Nerves: No dysarthria.     Coordination: Coordination normal.  Psychiatric:        Attention and Perception: She is attentive. She does not perceive auditory hallucinations.        Mood and Affect: Mood is not anxious or depressed. Affect is not labile, blunt, angry or inappropriate.        Speech: Speech normal. Speech is not slurred.        Thought Content: Thought content normal. Thought content is not paranoid. Thought content does not include homicidal or suicidal ideation.        Cognition and Memory: Cognition normal.        Judgment: Judgment normal.     Comments: Insight intact. No auditory or visual  hallucinations. No delusions.      Lab Review:     Component Value Date/Time   NA 138 04/04/2013 0915   K 4.3 04/04/2013 0915   CL 101 04/04/2013 0915   CO2 25 04/04/2013 0915   GLUCOSE 94 04/04/2013 0915   BUN 10 04/04/2013 0915   CREATININE 0.67 04/04/2013 0915   CALCIUM 9.3 04/04/2013 0915   PROT 6.9 04/04/2013 0915   ALBUMIN 3.8 04/04/2013 0915   AST 27 04/04/2013 0915   ALT 20 04/04/2013 0915   ALKPHOS 54 04/04/2013 0915   BILITOT 0.4 04/04/2013 0915  GFRNONAA >90 04/04/2013 0915   GFRAA >90 04/04/2013 0915       Component Value Date/Time   WBC 7.5 04/04/2013 0915   RBC 5.07 04/04/2013 0915   HGB 14.9 04/04/2013 0915   HCT 42.9 04/04/2013 0915   PLT PLATELET CLUMPS NOTED ON SMEAR, UNABLE TO ESTIMATE 04/04/2013 0915   MCV 84.6 04/04/2013 0915   MCH 29.4 04/04/2013 0915   MCHC 34.7 04/04/2013 0915   RDW 13.0 04/04/2013 0915   LYMPHSABS 1.8 04/04/2013 0915   MONOABS 0.4 04/04/2013 0915   EOSABS 0.2 04/04/2013 0915   BASOSABS 0.0 04/04/2013 0915    No results found for: POCLITH, LITHIUM   No results found for: PHENYTOIN, PHENOBARB, VALPROATE, CBMZ   .res Assessment: Plan:    Recurrent major depression in full remission (HCC) - Plan: buPROPion (WELLBUTRIN XL) 150 MG 24 hr tablet, lurasidone (LATUDA) 80 MG TABS tablet  History of psychosis  Psychosis, unspecified psychosis type (HCC) - Plan: lurasidone (LATUDA) 80 MG TABS tablet   No questions concerns about the meds.  Pleased.  No med changes made. Disc ways to deal with missed calories and Latuda if that occurs.  Call if questions  Disc spiritual guilt over prior psychotic episodes and support and education given.  Doing well.  High relapse risk without  Meds.  Disc prior relapse with attempted transition from risperidone to Abilify. No psychosis.  Discussed potential metabolic side effects associated with atypical antipsychotics, as well as potential risk for movement side effects. Advised pt to  contact office if movement side effects occur.  No evidence of TD  FU  6 mos  Meredith Staggers, MD, DFAPA   Please see After Visit Summary for patient specific instructions.  No future appointments.  No orders of the defined types were placed in this encounter.     -------------------------------

## 2019-11-23 ENCOUNTER — Telehealth: Payer: Self-pay | Admitting: Psychiatry

## 2019-11-23 NOTE — Telephone Encounter (Signed)
Are you asking if she has disability paper work?

## 2019-11-23 NOTE — Telephone Encounter (Signed)
Do you have a disability Fo

## 2019-11-24 ENCOUNTER — Telehealth: Payer: Self-pay

## 2019-11-24 NOTE — Telephone Encounter (Signed)
Prior authorization submitted and approved for LATUDA 80 MG effective 11/23/2019-11/22/2020 with [facility]  Patient notified

## 2019-12-27 ENCOUNTER — Telehealth: Payer: Self-pay | Admitting: Physician Assistant

## 2019-12-27 MED ORDER — LATUDA 120 MG PO TABS: 120.0000 mg | ORAL_TABLET | Freq: Every day | ORAL | 0 refills | Status: DC

## 2019-12-27 MED ORDER — LORAZEPAM 0.5 MG PO TABS: 0.5000 mg | ORAL_TABLET | Freq: Three times a day (TID) | ORAL | 0 refills | Status: DC | PRN

## 2019-12-27 NOTE — Telephone Encounter (Signed)
Pt called after hours/on call service. For several hours, has been feeling 'strange' like she did in 2015 when she had a psychotic break which led to a hospitalization. States she has heard few things on television or on the radio that were not night. She heard on PBS "in the beginning was the world" that scripture and she also heard it to say that in the beginning was Prudy Feeler was with Jesus. She used to have a lot of scrupulosity OCD but that has been better up until this situation. She is under a lot of stress at work and because of the Christmas season. Nothing else has changed. Has been sleeping okay. No signs or symptoms of infection. Of importance is that she had the COVID booster weekend before last and felt pretty bad for 3 or 4 days with that. She is on Latuda 80 mg as well as Wellbutrin. The Kasandra Knudsen has been very helpful for around 4 years now. She has always been on 80 mg. I recommend increasing the Latuda to 120 mg. I advised her to go ahead and take the 80 mg, 2 pills tonight and then pick up the Latuda 120 mg pills and stay on that until she is able to discuss the situation with Dr. Jennelle Human next Tuesday. Because of the Christmas holidays, we may not be able to get a prior authorization if it is needed. If that is the case, I would like for her to stay on the Latuda total of 160 mg until she can discuss with Dr. Jennelle Human on 01/01/2020. She seems very anxious on the phone and that is corroborated with her mom I believe who is also on the phone with her. She has never taken hydroxyzine and her PCP in the past has recommended it but because she gets hyper when she takes Benadryl, it is not a good idea to use the hydroxyzine. She has taken Ativan in the past. I will send in enough of that to get her through this weekend. If she gets worse, she either needs to go to [facility] emergency room or the new behavioral health urgent care center on [address]. She verbalizes understanding.

## 2019-12-28 ENCOUNTER — Other Ambulatory Visit: Payer: Self-pay | Admitting: Physician Assistant

## 2019-12-28 ENCOUNTER — Telehealth: Payer: Self-pay | Admitting: Physician Assistant

## 2019-12-28 MED ORDER — LORAZEPAM 0.5 MG PO TABS: 0.5000 mg | ORAL_TABLET | Freq: Three times a day (TID) | ORAL | 0 refills | Status: DC | PRN

## 2019-12-28 MED ORDER — LURASIDONE HCL 80 MG PO TABS: 80.0000 mg | ORAL_TABLET | Freq: Every day | ORAL | 0 refills | Status: DC

## 2019-12-28 MED ORDER — LURASIDONE HCL 40 MG PO TABS: 40.0000 mg | ORAL_TABLET | Freq: Every day | ORAL | 0 refills | Status: DC

## 2019-12-28 NOTE — Telephone Encounter (Signed)
Miranda Norman from [facility] called . They cannot fill the Latuda 120mg  b/c it is out of stock. Have called around and not avail. Can they fill the 80mg  and a 40mg  for her. Will need these sent in. Pt also requested of them that we transfer her Ativan Rx [facility].

## 2019-12-28 NOTE — Telephone Encounter (Signed)
Prescriptions were sent

## 2020-01-02 ENCOUNTER — Telehealth: Payer: Self-pay | Admitting: Psychiatry

## 2020-01-02 NOTE — Telephone Encounter (Signed)
Pt called stating she needs apt today with CC referring to telephone call 12/22. TH was on call. Pt was advised I would have to reach out to you about a work in apt. Please advise if you want to work in and I will advise Pt @ [phone].

## 2020-01-02 NOTE — Telephone Encounter (Signed)
Pt scheduled 12/29 @ 10:00

## 2020-01-02 NOTE — Telephone Encounter (Signed)
Agree with increase Latuda for now, but had been stable for years on 80 mg

## 2020-01-03 ENCOUNTER — Encounter (INDEPENDENT_AMBULATORY_CARE_PROVIDER_SITE_OTHER): Payer: Self-pay

## 2020-01-03 ENCOUNTER — Other Ambulatory Visit: Payer: Self-pay

## 2020-01-03 ENCOUNTER — Encounter: Payer: Self-pay | Admitting: Psychiatry

## 2020-01-03 ENCOUNTER — Ambulatory Visit (INDEPENDENT_AMBULATORY_CARE_PROVIDER_SITE_OTHER): Payer: 59 | Admitting: Psychiatry

## 2020-01-03 DIAGNOSIS — F3342 Major depressive disorder, recurrent, in full remission: Secondary | ICD-10-CM

## 2020-01-03 DIAGNOSIS — F29 Unspecified psychosis not due to a substance or known physiological condition: Secondary | ICD-10-CM

## 2020-01-03 MED ORDER — LURASIDONE HCL 120 MG PO TABS: 120.0000 mg | ORAL_TABLET | Freq: Every day | ORAL | 1 refills | Status: DC

## 2020-01-03 NOTE — Progress Notes (Signed)
Miranda Norman [id number] Oct 15, 1980 39 y.o.  Subjective:   Patient ID:  Miranda Norman is a 39 y.o. (DOB 12-08-1980) female.  Chief Complaint:  Chief Complaint  Patient presents with  . Follow-up  . recurrent psychosis    Recent onset    Depression        Miranda Norman presents to the office today for follow-up of a psychotic disorder, NOS and major depression.  10/2019 appt..  No med changes were made and she was doing well on her medications. Remained on Wellbutrin 450 and Latuda 80.  Covid vaccinated.  12/27/2019 phone call after hours: Patient started having hallucinations of voices talking about Satan and Jesus from the TV.  She had been consistent with her medication including Wellbutrin and Latuda 80 mg daily.  It was suggested she increase Latuda 220 or 160 mg until she could get in with Dr. Haywood Lasso.  She was feeling very anxious and was prescribed lorazepam.  01/03/2020 appointment urgently scheduled due to recent recurrence of psychosis.  The following was noted: Weird things happened with song on TV that seemed to talk about Prudy Feeler and God.  Was getting confused by other things she heard and saw on TV.  Callled office to increase latuda to 120 mg daily and RX Ativan 0.5 mg TID prn.  Didn't have the drug card or right dose to get 120 mg.  Started 120 mg 5 days ago.   Hard to say what's going on bc has been very sheltered since that time.  Was fine when people were with her that she knows well.  Turned off the TV and radio to limit stimuli.   Covid booster Friday 12/10 and felt bad after it for a couple of days.   Had episode where she felt like she was fainting and might die and go to hell.   No other occ of unusual missing meds or not taking it with food.   No preexisting problems with sleep or depression.  Some work stress lately.  Usually mother stays with her and for a couple of months has been more alone.   Sleepy with increase Latuda to 120 mg.  Some sleep to  escape.    Scared of recurrent psychosis and rehospitalization.  Taking Latuda with dinner.  Usually bed 9 and up at 6.  Latey more sleep. Not worked this week. Started [facility] hormone October 2020 Lo Loestrin FE Has notes.  Some anxiety and conflict over faith and prior psychosis.    Past Psychiatric Medication Trials: Latuda 80- 2017,  risperidone 2 mg twice daily high prolactin,  Abilify failed transition from risperidone  Wellbutrin 450, Lexapro 7.5, sertraline, lorazepam History of psychiatric hospitalization.  Review of Systems:  Review of Systems  Constitutional: Positive for fever.  Cardiovascular: Negative for palpitations.  Musculoskeletal: Negative for gait problem.  Neurological: Negative for dizziness, tremors and weakness.  Psychiatric/Behavioral: Positive for depression.       Please refer to HPI    Medications: I have reviewed the patient's current medications.  Current Outpatient Medications  Medication Sig Dispense Refill  . buPROPion (WELLBUTRIN XL) 150 MG 24 hr tablet Take 3 tablets (450 mg total) by mouth daily. 270 tablet 1  . LORazepam (ATIVAN) 0.5 MG tablet Take 1 tablet (0.5 mg total) by mouth every 8 (eight) hours as needed for anxiety. 15 tablet 0  . lurasidone (LATUDA) 40 MG TABS tablet Take 1 tablet (40 mg total) by mouth daily with supper. 30 tablet  0  . lurasidone (LATUDA) 80 MG TABS tablet Take 1 tablet (80 mg total) by mouth daily with supper. Plus the 40mg +80mg =120 mg 30 tablet 0  . Norethin-Eth Estrad-Fe Biphas (LO LOESTRIN FE PO) Take by mouth.     No current facility-administered medications for this visit.    Medication Side Effects: None  Allergies:  Allergies  Allergen Reactions  . Ceclor [Cefaclor]     Doesn't remember. Childhood reaction  . Erythromycin     Doesn't remember. Childhood reaction  . Penicillins Hives    Past Medical History:  Diagnosis Date  . CMV (cytomegalovirus) (HCC) 2006  . Hyperprolactinemia (HCC)   . Thyroid  goiter 4 years ago   "everything is normal"    Family History  Problem Relation Age of Onset  . Asthma Mother   . Goiter Other   . Other Neg Hx        hyperprolactinemia    Social History   Socioeconomic History  . Marital status: Single    Spouse name: Not on file  . Number of children: Not on file  . Years of education: Not on file  . Highest education level: Not on file  Occupational History  . Not on file  Tobacco Use  . Smoking status: Never Smoker  . Smokeless tobacco: Never Used  Substance and Sexual Activity  . Alcohol use: Yes    Comment: occasional  . Drug use: No  . Sexual activity: Not on file  Other Topics Concern  . Not on file  Social History Narrative  . Not on file   Social Determinants of Health   Financial Resource Strain: Not on file  Food Insecurity: Not on file  Transportation Needs: Not on file  Physical Activity: Not on file  Stress: Not on file  Social Connections: Not on file  Intimate Partner Violence: Not on file    Past Medical History, Surgical history, Social history, and Family history were reviewed and updated as appropriate.   Please see review of systems for further details on the patient's review from today.   Objective:   Physical Exam:  There were no vitals taken for this visit.  Physical Exam Constitutional:      General: She is not in acute distress.    Appearance: She is well-developed.  Musculoskeletal:        General: No deformity.  Neurological:     Mental Status: She is alert and oriented to person, place, and time.     Cranial Nerves: No dysarthria.     Coordination: Coordination normal.  Psychiatric:        Attention and Perception: She is attentive. She does not perceive auditory hallucinations.        Mood and Affect: Mood is anxious. Mood is not depressed. Affect is not labile, blunt, angry or inappropriate.        Speech: Speech normal. Speech is not slurred.        Thought Content: Thought content  is paranoid. Thought content does not include homicidal or suicidal ideation.        Cognition and Memory: Cognition normal.        Judgment: Judgment normal.     Comments: Insight intact. Recent scary IOR and paranoid thoughts of going to hell.      Lab Review:     Component Value Date/Time   NA 138 04/04/2013 0915   K 4.3 04/04/2013 0915   CL 101 04/04/2013 0915   CO2 25 04/04/2013  0915   GLUCOSE 94 04/04/2013 0915   BUN 10 04/04/2013 0915   CREATININE 0.67 04/04/2013 0915   CALCIUM 9.3 04/04/2013 0915   PROT 6.9 04/04/2013 0915   ALBUMIN 3.8 04/04/2013 0915   AST 27 04/04/2013 0915   ALT 20 04/04/2013 0915   ALKPHOS 54 04/04/2013 0915   BILITOT 0.4 04/04/2013 0915   GFRNONAA >90 04/04/2013 0915   GFRAA >90 04/04/2013 0915       Component Value Date/Time   WBC 7.5 04/04/2013 0915   RBC 5.07 04/04/2013 0915   HGB 14.9 04/04/2013 0915   HCT 42.9 04/04/2013 0915   PLT PLATELET CLUMPS NOTED ON SMEAR, UNABLE TO ESTIMATE 04/04/2013 0915   MCV 84.6 04/04/2013 0915   MCH 29.4 04/04/2013 0915   MCHC 34.7 04/04/2013 0915   RDW 13.0 04/04/2013 0915   LYMPHSABS 1.8 04/04/2013 0915   MONOABS 0.4 04/04/2013 0915   EOSABS 0.2 04/04/2013 0915   BASOSABS 0.0 04/04/2013 0915    No results found for: POCLITH, LITHIUM   No results found for: PHENYTOIN, PHENOBARB, VALPROATE, CBMZ   .res Assessment: Plan:    Psychosis, unspecified psychosis type (HCC)  Recurrent major depression in full remission (HCC)   Agree with increase to Latuda 120 mg daily. Disc ways to deal with missed calories and Latuda if that occurs.  Call if questions  Disc spiritual guilt over prior psychotic episodes and support and education given.  Disc handlinging recent psychosis.   RTW next week.   Disc fear of being around people who might trigger psychotic thought.   Agree with ST prn Ativan esp around people. Disc good prognosis.   Consider reduction in Wellbutrin DT risk of more psychosis.  Was  very situationally depressed at the time of starting Wellbutrin. Yes, reduce to Wellbutrin 300 mg daily.  Discussed potential metabolic side effects associated with atypical antipsychotics, as well as potential risk for movement side effects. Advised pt to contact office if movement side effects occur.  No evidence of TD  FU  01/12/19  Meredith Staggers, MD, DFAPA   Please see After Visit Summary for patient specific instructions.  Future Appointments  Date Time Provider Department Center  04/17/2020  9:00 AM Cottle, Steva Ready., MD [facility] None    No orders of the defined types were placed in this encounter.     -------------------------------

## 2020-01-12 ENCOUNTER — Encounter: Payer: Self-pay | Admitting: Psychiatry

## 2020-01-12 ENCOUNTER — Ambulatory Visit (INDEPENDENT_AMBULATORY_CARE_PROVIDER_SITE_OTHER): Payer: 59 | Admitting: Psychiatry

## 2020-01-12 ENCOUNTER — Other Ambulatory Visit: Payer: Self-pay

## 2020-01-12 DIAGNOSIS — F3342 Major depressive disorder, recurrent, in full remission: Secondary | ICD-10-CM

## 2020-01-12 DIAGNOSIS — F29 Unspecified psychosis not due to a substance or known physiological condition: Secondary | ICD-10-CM | POA: Diagnosis not present

## 2020-01-12 MED ORDER — LORAZEPAM 0.5 MG PO TABS: 0.5000 mg | ORAL_TABLET | Freq: Three times a day (TID) | ORAL | 1 refills | Status: DC | PRN

## 2020-01-12 MED ORDER — LATUDA 120 MG PO TABS: 120.0000 mg | ORAL_TABLET | Freq: Every day | ORAL | 1 refills | Status: DC

## 2020-01-12 NOTE — Progress Notes (Signed)
Miranda Norman [id number] 12/29/1980 40 y.o.  Subjective:   Patient ID:  Miranda Norman is a 40 y.o. (DOB 1980-01-17) female.  Chief Complaint:  Chief Complaint  Patient presents with  . Follow-up    Depression        Miranda Norman presents to the office today for follow-up of a psychotic disorder, NOS and major depression.  10/2019 appt..  No med changes were made and she was doing well on her medications. Remained on Wellbutrin 450 and Latuda 80.  Covid vaccinated.  12/27/2019 phone call after hours: Patient started having hallucinations of voices talking about Satan and Jesus from the TV.  She had been consistent with her medication including Wellbutrin and Latuda 80 mg daily.  It was suggested she increase Latuda 220 or 160 mg until she could get in with Dr. Haywood Lasso.  She was feeling very anxious and was prescribed lorazepam.  01/03/2020 appointment urgently scheduled due to recent recurrence of psychosis.  The following was noted: Weird things happened with song on TV that seemed to talk about Miranda Norman and God.  Was getting confused by other things she heard and saw on TV.  Callled office to increase latuda to 120 mg daily and RX Ativan 0.5 mg TID prn.  Didn't have the drug card or right dose to get 120 mg.  Started 120 mg 5 days ago.   Hard to say what's going on bc has been very sheltered since that time.  Was fine when people were with her that she knows well.  Turned off the TV and radio to limit stimuli.   Covid booster Friday 12/10 and felt bad after it for a couple of days.   Had episode where she felt like she was fainting and might die and go to hell.   No other occ of unusual missing meds or not taking it with food.   No preexisting problems with sleep or depression.  Some work stress lately.  Usually mother stays with her and for a couple of months has been more alone.   Sleepy with increase Latuda to 120 mg.  Some sleep to escape.    Scared of recurrent psychosis and  rehospitalization.  Taking Latuda with dinner.  Usually bed 9 and up at 6.  Latey more sleep. Not worked this week. Started [facility] hormone October 2020 Lo Loestrin FE Has notes. Plan: Agree with increase to Latuda 120 mg daily. Reduce Wellbutrin to 300 mg bc activation may make sx worse.   01/12/2020 urgent appointment to follow up relapse psychosis with following noted: Urgent appointment made today.  Because of recent psychosis. I've been good.  Back to work Monday and it went OK.  Kind of a normal week.   Anxiety is better.  Tolerates Ativan 1 mg AM.  Sleep good. No paranoid thoughts since last here.  No AH.  Not depressed.  No panic.   Tolerates meds except mild nausea after eating pm briefly. Mild sleepiness. Patient reports stable mood and denies depressed or irritable moods.  Patient denies any recent difficulty with anxiety.  Patient denies difficulty with sleep initiation or maintenance. Denies appetite disturbance.  Patient reports that energy and motivation have been good.  Patient denies any difficulty with concentration.  Patient denies any suicidal ideation.  Some anxiety and conflict over faith and prior psychosis.    Past Psychiatric Medication Trials: Latuda 80- 2017,  risperidone 2 mg twice daily high prolactin,  Abilify failed transition from risperidone  Wellbutrin 450, Lexapro 7.5, sertraline, lorazepam History of psychiatric hospitalization.  Review of Systems:  Review of Systems  Constitutional: Negative for fever.  Cardiovascular: Negative for chest pain and palpitations.  Gastrointestinal: Positive for nausea.  Musculoskeletal: Negative for gait problem.  Neurological: Negative for dizziness, tremors and weakness.  Psychiatric/Behavioral: Positive for depression.       Please refer to HPI    Medications: I have reviewed the patient's current medications.  Current Outpatient Medications  Medication Sig Dispense Refill  . buPROPion (WELLBUTRIN XL) 150 MG 24 hr  tablet Take 3 tablets (450 mg total) by mouth daily. (Patient taking differently: Take 300 mg by mouth daily.) 270 tablet 1  . Norethin-Eth Estrad-Fe Biphas (LO LOESTRIN FE PO) Take by mouth.    Marland Kitchen LORazepam (ATIVAN) 0.5 MG tablet Take 1 tablet (0.5 mg total) by mouth every 8 (eight) hours as needed for anxiety. 60 tablet 1  . Lurasidone HCl (LATUDA) 120 MG TABS Take 1 tablet (120 mg total) by mouth daily with supper. 30 tablet 1   No current facility-administered medications for this visit.    Medication Side Effects: None  Allergies:  Allergies  Allergen Reactions  . Ceclor [Cefaclor]     Doesn't remember. Childhood reaction  . Erythromycin     Doesn't remember. Childhood reaction  . Penicillins Hives    Past Medical History:  Diagnosis Date  . CMV (cytomegalovirus) (HCC) 2006  . Hyperprolactinemia (HCC)   . Thyroid goiter 4 years ago   "everything is normal"    Family History  Problem Relation Age of Onset  . Asthma Mother   . Goiter Other   . Other Neg Hx        hyperprolactinemia    Social History   Socioeconomic History  . Marital status: Single    Spouse name: Not on file  . Number of children: Not on file  . Years of education: Not on file  . Highest education level: Not on file  Occupational History  . Not on file  Tobacco Use  . Smoking status: Never Smoker  . Smokeless tobacco: Never Used  Substance and Sexual Activity  . Alcohol use: Yes    Comment: occasional  . Drug use: No  . Sexual activity: Not on file  Other Topics Concern  . Not on file  Social History Narrative  . Not on file   Social Determinants of Health   Financial Resource Strain: Not on file  Food Insecurity: Not on file  Transportation Needs: Not on file  Physical Activity: Not on file  Stress: Not on file  Social Connections: Not on file  Intimate Partner Violence: Not on file    Past Medical History, Surgical history, Social history, and Family history were reviewed and  updated as appropriate.   Please see review of systems for further details on the patient's review from today.   Objective:   Physical Exam:  There were no vitals taken for this visit.  Physical Exam Constitutional:      General: She is not in acute distress.    Appearance: She is well-developed.  Musculoskeletal:        General: No deformity.  Neurological:     Mental Status: She is alert and oriented to person, place, and time.     Cranial Nerves: No dysarthria.     Coordination: Coordination normal.  Psychiatric:        Attention and Perception: She is attentive. She does not perceive auditory hallucinations.  Mood and Affect: Mood is not anxious or depressed. Affect is not labile, blunt, angry or inappropriate.        Speech: Speech normal. Speech is not slurred.        Thought Content: Thought content is not paranoid. Thought content does not include homicidal or suicidal ideation.        Cognition and Memory: Cognition normal.        Judgment: Judgment normal.     Comments: Insight intact. Paranoia resolved.     Lab Review:     Component Value Date/Time   NA 138 04/04/2013 0915   K 4.3 04/04/2013 0915   CL 101 04/04/2013 0915   CO2 25 04/04/2013 0915   GLUCOSE 94 04/04/2013 0915   BUN 10 04/04/2013 0915   CREATININE 0.67 04/04/2013 0915   CALCIUM 9.3 04/04/2013 0915   PROT 6.9 04/04/2013 0915   ALBUMIN 3.8 04/04/2013 0915   AST 27 04/04/2013 0915   ALT 20 04/04/2013 0915   ALKPHOS 54 04/04/2013 0915   BILITOT 0.4 04/04/2013 0915   GFRNONAA >90 04/04/2013 0915   GFRAA >90 04/04/2013 0915       Component Value Date/Time   WBC 7.5 04/04/2013 0915   RBC 5.07 04/04/2013 0915   HGB 14.9 04/04/2013 0915   HCT 42.9 04/04/2013 0915   PLT PLATELET CLUMPS NOTED ON SMEAR, UNABLE TO ESTIMATE 04/04/2013 0915   MCV 84.6 04/04/2013 0915   MCH 29.4 04/04/2013 0915   MCHC 34.7 04/04/2013 0915   RDW 13.0 04/04/2013 0915   LYMPHSABS 1.8 04/04/2013 0915    MONOABS 0.4 04/04/2013 0915   EOSABS 0.2 04/04/2013 0915   BASOSABS 0.0 04/04/2013 0915    No results found for: POCLITH, LITHIUM   No results found for: PHENYTOIN, PHENOBARB, VALPROATE, CBMZ   .res Assessment: Plan:    Psychosis, unspecified psychosis type ([place]) - Plan: LORazepam (ATIVAN) 0.5 MG tablet, Lurasidone HCl (LATUDA) 120 MG TABS  Recurrent major depression in full remission ([place]) - Plan: Lurasidone HCl (LATUDA) 120 MG TABS   Continue Latuda 120 mg daily. Disc ways to deal with missed calories and Latuda if that occurs.  Call if questions  Disc spiritual guilt over prior psychotic episodes and support and education given.  Disc handlinging recent psychosis.   RTW went well. Agree with ST prn Ativan esp around people prn. We discussed the short-term risks associated with benzodiazepines including sedation and increased fall risk among others.  Discussed long-term side effect risk including dependence, potential withdrawal symptoms, and the potential eventual dose-related risk of dementia.  But recent studies from 2020 dispute this association between benzodiazepines and dementia risk. Newer studies in 2020 do not support an association with dementia.  Disc good prognosis.  Can RTW FT  Was very situationally depressed at the time of starting Wellbutrin. Continue Wellbutrin 300 mg daily.  Discussed potential metabolic side effects associated with atypical antipsychotics, as well as potential risk for movement side effects. Advised pt to contact office if movement side effects occur.  No evidence of TD  FU  6 weeks  Lynder Parents, MD, DFAPA   Please see After Visit Summary for patient specific instructions.  Future Appointments  Date Time Provider [place]  04/17/2020  9:00 AM Cottle, Billey Co., MD [facility] None    No orders of the defined types were placed in this encounter.     -------------------------------

## 2020-03-04 ENCOUNTER — Other Ambulatory Visit: Payer: Self-pay

## 2020-03-04 ENCOUNTER — Encounter: Payer: Self-pay | Admitting: Psychiatry

## 2020-03-04 ENCOUNTER — Ambulatory Visit (INDEPENDENT_AMBULATORY_CARE_PROVIDER_SITE_OTHER): Payer: 59 | Admitting: Psychiatry

## 2020-03-04 DIAGNOSIS — F29 Unspecified psychosis not due to a substance or known physiological condition: Secondary | ICD-10-CM | POA: Diagnosis not present

## 2020-03-04 DIAGNOSIS — Z8659 Personal history of other mental and behavioral disorders: Secondary | ICD-10-CM | POA: Diagnosis not present

## 2020-03-04 DIAGNOSIS — F3342 Major depressive disorder, recurrent, in full remission: Secondary | ICD-10-CM

## 2020-03-04 MED ORDER — BUPROPION HCL ER (XL) 300 MG PO TB24: 300.0000 mg | ORAL_TABLET | Freq: Every day | ORAL | 1 refills | Status: DC

## 2020-03-04 MED ORDER — LATUDA 120 MG PO TABS: 120.0000 mg | ORAL_TABLET | Freq: Every day | ORAL | 1 refills | Status: DC

## 2020-03-04 NOTE — Progress Notes (Signed)
Miranda Norman [id number] 06/04/1980 40 y.o.  Subjective:   Patient ID:  Miranda Norman is a 40 y.o. (DOB [birth year]) female.  Chief Complaint:  Chief Complaint  Patient presents with  . Follow-up  . Psychosis, unspecified psychosis type (HCC)    Depression        Miranda Norman presents to the office today for follow-up of a psychotic disorder, NOS and major depression.  10/2019 appt..  No med changes were made and she was doing well on her medications. Remained on Wellbutrin 450 and Latuda 80.  Covid vaccinated.  12/27/2019 phone call after hours: Patient started having hallucinations of voices talking about Satan and Jesus from the TV.  She had been consistent with her medication including Wellbutrin and Latuda 80 mg daily.  It was suggested she increase Latuda to 120 mg or 160 mg until she could get in with Dr. Haywood Lasso.  She was feeling very anxious and was prescribed lorazepam.  01/03/2020 appointment urgently scheduled due to recent recurrence of psychosis.  The following was noted: Weird things happened with song on TV that seemed to talk about Prudy Feeler and God.  Was getting confused by other things she heard and saw on TV.  Callled office to increase latuda to 120 mg daily and RX Ativan 0.5 mg TID prn.  Didn't have the drug card or right dose to get 120 mg.  Started 120 mg 5 days ago.   Hard to say what's going on bc has been very sheltered since that time.  Was fine when people were with her that she knows well.  Turned off the TV and radio to limit stimuli.   Covid booster Friday 12/10 and felt bad after it for a couple of days.   Had episode where she felt like she was fainting and might die and go to hell.   No other occ of unusual missing meds or not taking it with food.   No preexisting problems with sleep or depression.  Some work stress lately.  Usually mother stays with her and for a couple of months has been more alone.   Sleepy with increase Latuda to 120 mg.  Some  sleep to escape.    Scared of recurrent psychosis and rehospitalization.  Taking Latuda with dinner.  Usually bed 9 and up at 6.  Latey more sleep. Not worked this week. Started [facility] hormone October 2020 Lo Loestrin FE Has notes. Plan: Agree with increase to Latuda 120 mg daily. Reduce Wellbutrin to 300 mg bc activation may make sx worse.   01/12/2020 urgent appointment to follow up relapse psychosis with following noted: Urgent appointment made today.  Because of recent psychosis. I've been good.  Back to work Monday and it went OK.  Kind of a normal week.   Anxiety is better.  Tolerates Ativan 1 mg AM.  Sleep good. No paranoid thoughts since last here.  No AH.  Not depressed.  No panic.   Tolerates meds except mild nausea after eating pm briefly. Mild sleepiness. Patient reports stable mood and denies depressed or irritable moods.  Patient denies any recent difficulty with anxiety.  Patient denies difficulty with sleep initiation or maintenance. Denies appetite disturbance.  Patient reports that energy and motivation have been good.  Patient denies any difficulty with concentration.  Patient denies any suicidal ideation.  Plan: no med changes  03/04/20 appt wit following noted: Good.  Mood and anxiety good.  No psychosis since here.  Good work function.  Tolerates meds well.   Mild nausea not bad lately. Patient reports stable mood and denies depressed or irritable moods.  Patient denies any recent difficulty with anxiety.  Patient denies difficulty with sleep initiation or maintenance. Denies appetite disturbance.  Patient reports that energy and motivation have been good.  Patient denies any difficulty with concentration.  Patient denies any suicidal ideation.  Some anxiety and conflict over faith and prior psychosis.    Past Psychiatric Medication Trials: Latuda 80- 2017,  risperidone 2 mg twice daily high prolactin,  Abilify failed transition from risperidone  Wellbutrin 450, Lexapro  7.5, sertraline, lorazepam History of psychiatric hospitalization.  Review of Systems:  Review of Systems  Constitutional: Negative for fever.  Cardiovascular: Negative for chest pain and palpitations.  Gastrointestinal: Negative for nausea.  Musculoskeletal: Negative for gait problem.  Neurological: Negative for dizziness, tremors and weakness.  Psychiatric/Behavioral: Positive for depression.       Please refer to HPI    Medications: I have reviewed the patient's current medications.  Current Outpatient Medications  Medication Sig Dispense Refill  . Norethin-Eth Estrad-Fe Biphas (LO LOESTRIN FE PO) Take by mouth.    Marland Kitchen. buPROPion (WELLBUTRIN XL) 300 MG 24 hr tablet Take 1 tablet (300 mg total) by mouth daily. 90 tablet 1  . LORazepam (ATIVAN) 0.5 MG tablet Take 1 tablet (0.5 mg total) by mouth every 8 (eight) hours as needed for anxiety. (Patient not taking: Reported on 03/04/2020) 60 tablet 1  . Lurasidone HCl (LATUDA) 120 MG TABS Take 1 tablet (120 mg total) by mouth daily with supper. 90 tablet 1   No current facility-administered medications for this visit.    Medication Side Effects: None  Allergies:  Allergies  Allergen Reactions  . Ceclor [Cefaclor]     Doesn't remember. Childhood reaction  . Erythromycin     Doesn't remember. Childhood reaction  . Penicillins Hives    Past Medical History:  Diagnosis Date  . CMV (cytomegalovirus) (HCC) 2006  . Hyperprolactinemia (HCC)   . Thyroid goiter 4 years ago   "everything is normal"    Family History  Problem Relation Age of Onset  . Asthma Mother   . Goiter Other   . Other Neg Hx        hyperprolactinemia    Social History   Socioeconomic History  . Marital status: Single    Spouse name: Not on file  . Number of children: Not on file  . Years of education: Not on file  . Highest education level: Not on file  Occupational History  . Not on file  Tobacco Use  . Smoking status: Never Smoker  . Smokeless  tobacco: Never Used  Substance and Sexual Activity  . Alcohol use: Yes    Comment: occasional  . Drug use: No  . Sexual activity: Not on file  Other Topics Concern  . Not on file  Social History Narrative  . Not on file   Social Determinants of Health   Financial Resource Strain: Not on file  Food Insecurity: Not on file  Transportation Needs: Not on file  Physical Activity: Not on file  Stress: Not on file  Social Connections: Not on file  Intimate Partner Violence: Not on file    Past Medical History, Surgical history, Social history, and Family history were reviewed and updated as appropriate.   Please see review of systems for further details on the patient's review from today.   Objective:   Physical Exam:  There were  no vitals taken for this visit.  Physical Exam Constitutional:      General: She is not in acute distress.    Appearance: She is well-developed.  Musculoskeletal:        General: No deformity.  Neurological:     Mental Status: She is alert and oriented to person, place, and time.     Cranial Nerves: No dysarthria.     Coordination: Coordination normal.  Psychiatric:        Attention and Perception: She is attentive. She does not perceive auditory hallucinations.        Mood and Affect: Mood is not anxious or depressed. Affect is not labile, blunt, angry or inappropriate.        Speech: Speech normal. Speech is not slurred.        Thought Content: Thought content is not paranoid. Thought content does not include homicidal or suicidal ideation.        Cognition and Memory: Cognition normal.        Judgment: Judgment normal.     Comments: Insight intact. Paranoia resolved after increase Latuda.     Lab Review:     Component Value Date/Time   NA 138 04/04/2013 0915   K 4.3 04/04/2013 0915   CL 101 04/04/2013 0915   CO2 25 04/04/2013 0915   GLUCOSE 94 04/04/2013 0915   BUN 10 04/04/2013 0915   CREATININE 0.67 04/04/2013 0915   CALCIUM 9.3  04/04/2013 0915   PROT 6.9 04/04/2013 0915   ALBUMIN 3.8 04/04/2013 0915   AST 27 04/04/2013 0915   ALT 20 04/04/2013 0915   ALKPHOS 54 04/04/2013 0915   BILITOT 0.4 04/04/2013 0915   GFRNONAA >90 04/04/2013 0915   GFRAA >90 04/04/2013 0915       Component Value Date/Time   WBC 7.5 04/04/2013 0915   RBC 5.07 04/04/2013 0915   HGB 14.9 04/04/2013 0915   HCT 42.9 04/04/2013 0915   PLT PLATELET CLUMPS NOTED ON SMEAR, UNABLE TO ESTIMATE 04/04/2013 0915   MCV 84.6 04/04/2013 0915   MCH 29.4 04/04/2013 0915   MCHC 34.7 04/04/2013 0915   RDW 13.0 04/04/2013 0915   LYMPHSABS 1.8 04/04/2013 0915   MONOABS 0.4 04/04/2013 0915   EOSABS 0.2 04/04/2013 0915   BASOSABS 0.0 04/04/2013 0915    No results found for: POCLITH, LITHIUM   No results found for: PHENYTOIN, PHENOBARB, VALPROATE, CBMZ   .res Assessment: Plan:    Recurrent major depression in full remission (HCC) - Plan: buPROPion (WELLBUTRIN XL) 300 MG 24 hr tablet, Lurasidone HCl (LATUDA) 120 MG TABS  History of psychosis  Psychosis, unspecified psychosis type (HCC) - Plan: Lurasidone HCl (LATUDA) 120 MG TABS   Continue Latuda 120 mg daily. It resolved recent paranoia in December 2021.  Disc ways to deal with missed calories and Latuda if that occurs.  Call if questions  Disc spiritual guilt over prior psychotic episodes and support and education given.  Agree with ST prn Ativan esp around people prn. Not needed lately. We discussed the short-term risks associated with benzodiazepines including sedation and increased fall risk among others.  Discussed long-term side effect risk including dependence, potential withdrawal symptoms, and the potential eventual dose-related risk of dementia.  But recent studies from 2020 dispute this association between benzodiazepines and dementia risk. Newer studies in 2020 do not support an association with dementia.  Disc good prognosis.    Was very situationally depressed at the time of  starting Wellbutrin. Continue Wellbutrin 300  mg daily.  Discussed potential metabolic side effects associated with atypical antipsychotics, as well as potential risk for movement side effects. Advised pt to contact office if movement side effects occur.  No evidence of TD  FU  3 mos  Meredith Staggers, MD, DFAPA   Please see After Visit Summary for patient specific instructions.  Future Appointments  Date Time Provider Department Center  04/17/2020  9:00 AM Cottle, Steva Ready., MD CP-[facility] None    No orders of the defined types were placed in this encounter.     -------------------------------

## 2020-04-17 ENCOUNTER — Ambulatory Visit: Payer: 59 | Admitting: Psychiatry

## 2020-05-28 ENCOUNTER — Ambulatory Visit: Payer: 59 | Admitting: Psychiatry

## 2020-06-26 ENCOUNTER — Ambulatory Visit: Payer: 59 | Admitting: Psychiatry

## 2020-06-26 ENCOUNTER — Other Ambulatory Visit: Payer: Self-pay

## 2020-06-26 ENCOUNTER — Encounter: Payer: Self-pay | Admitting: Psychiatry

## 2020-06-26 DIAGNOSIS — F29 Unspecified psychosis not due to a substance or known physiological condition: Secondary | ICD-10-CM | POA: Diagnosis not present

## 2020-06-26 DIAGNOSIS — Z8659 Personal history of other mental and behavioral disorders: Secondary | ICD-10-CM | POA: Diagnosis not present

## 2020-06-26 DIAGNOSIS — F3342 Major depressive disorder, recurrent, in full remission: Secondary | ICD-10-CM | POA: Diagnosis not present

## 2020-06-26 MED ORDER — BUPROPION HCL ER (XL) 300 MG PO TB24
300.0000 mg | ORAL_TABLET | Freq: Every day | ORAL | 1 refills | Status: DC
Start: 2020-06-26 — End: 2020-12-12

## 2020-06-26 MED ORDER — LATUDA 120 MG PO TABS: 120.0000 mg | ORAL_TABLET | Freq: Every day | ORAL | 1 refills | Status: DC

## 2020-06-26 NOTE — Progress Notes (Signed)
Miranda Norman [id number] September 16, 1980 40 y.o.  Subjective:   Patient ID:  Miranda Norman is a 40 y.o. (DOB Mar 12, 1980) female.  Chief Complaint:  Chief Complaint  Patient presents with   Follow-up   Depression    Depression       Miranda Norman presents to the office today for follow-up of a psychotic disorder, NOS and major depression.  10/2019 appt..  No med changes were made and she was doing well on her medications. Remained on Wellbutrin 450 and Latuda 80.  Covid vaccinated.  12/27/2019 phone call after hours: Patient started having hallucinations of voices talking about Satan and Jesus from the TV.  She had been consistent with her medication including Wellbutrin and Latuda 80 mg daily.  It was suggested she increase Latuda to 120 mg or 160 mg until she could get in with Dr. Haywood Lasso.  She was feeling very anxious and was prescribed lorazepam.  01/03/2020 appointment urgently scheduled due to recent recurrence of psychosis.  The following was noted: Weird things happened with song on TV that seemed to talk about Prudy Feeler and God.  Was getting confused by other things she heard and saw on TV.  Callled office to increase latuda to 120 mg daily and RX Ativan 0.5 mg TID prn.  Didn't have the drug card or right dose to get 120 mg.  Started 120 mg 5 days ago.   Hard to say what's going on bc has been very sheltered since that time.  Was fine when people were with her that she knows well.  Turned off the TV and radio to limit stimuli.   Covid booster Friday 12/10 and felt bad after it for a couple of days.   Had episode where she felt like she was fainting and might die and go to hell.   No other occ of unusual missing meds or not taking it with food.   No preexisting problems with sleep or depression.  Some work stress lately.  Usually mother stays with her and for a couple of months has been more alone.   Sleepy with increase Latuda to 120 mg.  Some sleep to escape.    Scared of  recurrent psychosis and rehospitalization.  Taking Latuda with dinner.  Usually bed 9 and up at 6.  Latey more sleep. Not worked this week. Started [facility] hormone October 2020 Lo Loestrin FE Has notes. Plan: Agree with increase to Latuda 120 mg daily. Reduce Wellbutrin to 300 mg bc activation may make sx worse.   01/12/2020 urgent appointment to follow up relapse psychosis with following noted: Urgent appointment made today.  Because of recent psychosis. I've been good.  Back to work Monday and it went OK.  Kind of a normal week.   Anxiety is better.  Tolerates Ativan 1 mg AM.  Sleep good. No paranoid thoughts since last here.  No AH.  Not depressed.  No panic.   Tolerates meds except mild nausea after eating pm briefly. Mild sleepiness. Patient reports stable mood and denies depressed or irritable moods.  Patient denies any recent difficulty with anxiety.  Patient denies difficulty with sleep initiation or maintenance. Denies appetite disturbance.  Patient reports that energy and motivation have been good.  Patient denies any difficulty with concentration.  Patient denies any suicidal ideation.  Plan: no med changes  03/04/20 appt wit following noted: Good.  Mood and anxiety good.  No psychosis since here.  Good work function.   Tolerates meds well.  Mild nausea not bad lately. Patient reports stable mood and denies depressed or irritable moods.  Patient denies any recent difficulty with anxiety.  Patient denies difficulty with sleep initiation or maintenance. Denies appetite disturbance.  Patient reports that energy and motivation have been good.  Patient denies any difficulty with concentration.  Patient denies any suicidal ideation. Plan no changes  6/22 /2022 appointment with the following noted: Still good with meds and mood. Mild nausea otherwise no SE. Latuda with a meal. Patient reports stable mood and denies depressed or irritable moods.  Patient denies any recent difficulty with  anxiety.  Patient denies difficulty with sleep initiation or maintenance. Denies appetite disturbance.  Patient reports that energy and motivation have been good.  Patient denies any difficulty with concentration.  Patient denies any suicidal ideation.  Interest in rare plants.  Some anxiety and conflict over faith and prior psychosis.    Past Psychiatric Medication Trials: Latuda 80- 2017,  risperidone 2 mg twice daily high prolactin,  Abilify failed transition from risperidone  Wellbutrin 450, Lexapro 7.5, sertraline, lorazepam History of psychiatric hospitalization.  Review of Systems:  Review of Systems  Constitutional:  Negative for fever.  Cardiovascular:  Negative for palpitations.  Gastrointestinal:  Negative for nausea.  Musculoskeletal:  Negative for gait problem.  Neurological:  Negative for dizziness, tremors and weakness.  Psychiatric/Behavioral:         Please refer to HPI   Medications: I have reviewed the patient's current medications.  Current Outpatient Medications  Medication Sig Dispense Refill   buPROPion (WELLBUTRIN XL) 300 MG 24 hr tablet Take 1 tablet (300 mg total) by mouth daily. 90 tablet 1   LORazepam (ATIVAN) 0.5 MG tablet Take 1 tablet (0.5 mg total) by mouth every 8 (eight) hours as needed for anxiety. (Patient not taking: No sig reported) 60 tablet 1   Lurasidone HCl (LATUDA) 120 MG TABS Take 1 tablet (120 mg total) by mouth daily with supper. 90 tablet 1   Norethin-Eth Estrad-Fe Biphas (LO LOESTRIN FE PO) Take by mouth. (Patient not taking: Reported on 06/26/2020)     No current facility-administered medications for this visit.    Medication Side Effects: None  Allergies:  Allergies  Allergen Reactions   Ceclor [Cefaclor]     Doesn't remember. Childhood reaction   Erythromycin     Doesn't remember. Childhood reaction   Penicillins Hives    Past Medical History:  Diagnosis Date   CMV (cytomegalovirus) (HCC) 2006   Hyperprolactinemia (HCC)     Thyroid goiter 4 years ago   "everything is normal"    Family History  Problem Relation Age of Onset   Asthma Mother    Goiter Other    Other Neg Hx        hyperprolactinemia    Social History   Socioeconomic History   Marital status: Single    Spouse name: Not on file   Number of children: Not on file   Years of education: Not on file   Highest education level: Not on file  Occupational History   Not on file  Tobacco Use   Smoking status: Never   Smokeless tobacco: Never  Substance and Sexual Activity   Alcohol use: Yes    Comment: occasional   Drug use: No   Sexual activity: Not on file  Other Topics Concern   Not on file  Social History Narrative   Not on file   Social Determinants of Health   Financial Resource Strain: Not  on file  Food Insecurity: Not on file  Transportation Needs: Not on file  Physical Activity: Not on file  Stress: Not on file  Social Connections: Not on file  Intimate Partner Violence: Not on file    Past Medical History, Surgical history, Social history, and Family history were reviewed and updated as appropriate.   Please see review of systems for further details on the patient's review from today.   Objective:   Physical Exam:  There were no vitals taken for this visit.  Physical Exam Constitutional:      General: She is not in acute distress.    Appearance: She is well-developed.  Musculoskeletal:        General: No deformity.  Neurological:     Mental Status: She is alert and oriented to person, place, and time.     Cranial Nerves: No dysarthria.     Coordination: Coordination normal.  Psychiatric:        Attention and Perception: She is attentive. She does not perceive auditory hallucinations.        Mood and Affect: Mood is not anxious or depressed. Affect is not labile, blunt, angry, tearful or inappropriate.        Speech: Speech normal. Speech is not slurred.        Thought Content: Thought content is not  paranoid. Thought content does not include homicidal or suicidal ideation.        Cognition and Memory: Cognition normal.        Judgment: Judgment normal.     Comments: Insight intact. Paranoia resolved after increase Latuda.    Lab Review:     Component Value Date/Time   NA 138 04/04/2013 0915   K 4.3 04/04/2013 0915   CL 101 04/04/2013 0915   CO2 25 04/04/2013 0915   GLUCOSE 94 04/04/2013 0915   BUN 10 04/04/2013 0915   CREATININE 0.67 04/04/2013 0915   CALCIUM 9.3 04/04/2013 0915   PROT 6.9 04/04/2013 0915   ALBUMIN 3.8 04/04/2013 0915   AST 27 04/04/2013 0915   ALT 20 04/04/2013 0915   ALKPHOS 54 04/04/2013 0915   BILITOT 0.4 04/04/2013 0915   GFRNONAA >90 04/04/2013 0915   GFRAA >90 04/04/2013 0915       Component Value Date/Time   WBC 7.5 04/04/2013 0915   RBC 5.07 04/04/2013 0915   HGB 14.9 04/04/2013 0915   HCT 42.9 04/04/2013 0915   PLT PLATELET CLUMPS NOTED ON SMEAR, UNABLE TO ESTIMATE 04/04/2013 0915   MCV 84.6 04/04/2013 0915   MCH 29.4 04/04/2013 0915   MCHC 34.7 04/04/2013 0915   RDW 13.0 04/04/2013 0915   LYMPHSABS 1.8 04/04/2013 0915   MONOABS 0.4 04/04/2013 0915   EOSABS 0.2 04/04/2013 0915   BASOSABS 0.0 04/04/2013 0915    No results found for: POCLITH, LITHIUM   No results found for: PHENYTOIN, PHENOBARB, VALPROATE, CBMZ   .res Assessment: Plan:    Recurrent major depression in full remission (HCC) - Plan: buPROPion (WELLBUTRIN XL) 300 MG 24 hr tablet, Lurasidone HCl (LATUDA) 120 MG TABS  History of psychosis  Psychosis, unspecified psychosis type (HCC) - Plan: Lurasidone HCl (LATUDA) 120 MG TABS   Continue Latuda 120 mg daily. It resolved recent paranoia in December 2021.  Disc ways to deal with missed calories and Latuda if that occurs.  Call if questions  Agree with ST prn Ativan esp around people prn. Not needed lately. We discussed the short-term risks associated with benzodiazepines including sedation and  increased fall risk  among others.  Discussed long-term side effect risk including dependence, potential withdrawal symptoms, and the potential eventual dose-related risk of dementia.  But recent studies from 2020 dispute this association between benzodiazepines and dementia risk. Newer studies in 2020 do not support an association with dementia.  Disc good prognosis.    Was very situationally depressed at the time of starting Wellbutrin. Continue Wellbutrin 300 mg daily.  Discussed potential metabolic side effects associated with atypical antipsychotics, as well as potential risk for movement side effects. Advised pt to contact office if movement side effects occur.  No evidence of TD  No med changes indicated  FU  4-6 mos  Meredith Staggers, MD, DFAPA   Please see After Visit Summary for patient specific instructions.  No future appointments.   No orders of the defined types were placed in this encounter.     -------------------------------

## 2020-10-29 ENCOUNTER — Ambulatory Visit: Payer: 59 | Admitting: Psychiatry

## 2020-12-12 ENCOUNTER — Encounter: Payer: Self-pay | Admitting: Psychiatry

## 2020-12-12 ENCOUNTER — Other Ambulatory Visit: Payer: Self-pay

## 2020-12-12 ENCOUNTER — Ambulatory Visit: Payer: 59 | Admitting: Psychiatry

## 2020-12-12 DIAGNOSIS — F29 Unspecified psychosis not due to a substance or known physiological condition: Secondary | ICD-10-CM

## 2020-12-12 DIAGNOSIS — F3342 Major depressive disorder, recurrent, in full remission: Secondary | ICD-10-CM | POA: Diagnosis not present

## 2020-12-12 MED ORDER — BUPROPION HCL ER (XL) 300 MG PO TB24: 300.0000 mg | ORAL_TABLET | Freq: Every day | ORAL | 1 refills | Status: DC

## 2020-12-12 MED ORDER — LATUDA 120 MG PO TABS: 120.0000 mg | ORAL_TABLET | Freq: Every day | ORAL | 1 refills | Status: DC

## 2020-12-12 NOTE — Progress Notes (Signed)
LAKYNN BRIETZKE [id number] 1980/04/17 40 y.o.  Subjective:   Patient ID:  Miranda Norman is a 40 y.o. (DOB [birth year]) female.  Chief Complaint:  Chief Complaint  Patient presents with   Follow-up    Psychosis, unspecified psychosis type ([place])   Depression    Depression       Miranda Norman presents to the office today for follow-up of a psychotic disorder, NOS and major depression.  10/2019 appt..  No med changes were made and she was doing well on her medications. Remained on Wellbutrin 450 and Latuda 80.  Covid vaccinated.  12/27/2019 phone call after hours: Patient started having hallucinations of voices talking about Satan and Jesus from the TV.  She had been consistent with her medication including Wellbutrin and Latuda 80 mg daily.  It was suggested she increase Latuda to 120 mg or 160 mg until she could get in with Dr. Charlott Holler.  She was feeling very anxious and was prescribed lorazepam.  01/03/2020 appointment urgently scheduled due to recent recurrence of psychosis.  The following was noted: Weird things happened with song on TV that seemed to talk about [name] and God.  Was getting confused by other things she heard and saw on TV.  Callled office to increase latuda to 120 mg daily and RX Ativan 0.5 mg TID prn.  Didn't have the drug card or right dose to get 120 mg.  Started 120 mg 5 days ago.   Hard to say what's going on bc has been very sheltered since that time.  Was fine when people were with her that she knows well.  Turned off the TV and radio to limit stimuli.   Covid booster Friday 12/10 and felt bad after it for a couple of days.   Had episode where she felt like she was fainting and might die and go to hell.   No other occ of unusual missing meds or not taking it with food.   No preexisting problems with sleep or depression.  Some work stress lately.  Usually mother stays with her and for a couple of months has been more alone.   Sleepy with increase Latuda to  120 mg.  Some sleep to escape.    Scared of recurrent psychosis and rehospitalization.  Taking Latuda with dinner.  Usually bed 9 and up at 6.  Latey more sleep. Not worked this week. Started [facility] hormone October 2020 Lo Loestrin FE Has notes. Plan: Agree with increase to Latuda 120 mg daily. Reduce Wellbutrin to 300 mg bc activation may make sx worse.   01/12/2020 urgent appointment to follow up relapse psychosis with following noted: Urgent appointment made today.  Because of recent psychosis. I've been good.  Back to work Monday and it went OK.  Kind of a normal week.   Anxiety is better.  Tolerates Ativan 1 mg AM.  Sleep good. No paranoid thoughts since last here.  No AH.  Not depressed.  No panic.   Tolerates meds except mild nausea after eating pm briefly. Mild sleepiness. Patient reports stable mood and denies depressed or irritable moods.  Patient denies any recent difficulty with anxiety.  Patient denies difficulty with sleep initiation or maintenance. Denies appetite disturbance.  Patient reports that energy and motivation have been good.  Patient denies any difficulty with concentration.  Patient denies any suicidal ideation.  Plan: no med changes  03/04/20 appt wit following noted: Good.  Mood and anxiety good.  No psychosis since here.  Good work function.   Tolerates meds well.   Mild nausea not bad lately. Patient reports stable mood and denies depressed or irritable moods.  Patient denies any recent difficulty with anxiety.  Patient denies difficulty with sleep initiation or maintenance. Denies appetite disturbance.  Patient reports that energy and motivation have been good.  Patient denies any difficulty with concentration.  Patient denies any suicidal ideation. Plan no changes  6/22 /2022 appointment with the following noted: Still good with meds and mood. Mild nausea otherwise no SE. Latuda with a meal. Patient reports stable mood and denies depressed or irritable moods.   Patient denies any recent difficulty with anxiety.  Patient denies difficulty with sleep initiation or maintenance. Denies appetite disturbance.  Patient reports that energy and motivation have been good.  Patient denies any difficulty with concentration.  Patient denies any suicidal ideation.  Interest in rare plants. Plan: Continue Wellbutrin 300 mg every morning Continue Latuda 120 mg daily Continue lorazepam as needed  12/12/2020 appointment with the following noted: Remains on Wellbutrin 300 mg every morning and Latuda 120 mg daily.  Not taking lorazepam lately. Good still.   Patient reports stable mood and denies depressed or irritable moods.  Patient denies any recent difficulty with anxiety.  Patient denies difficulty with sleep initiation or maintenance. Denies appetite disturbance.  Patient reports that energy and motivation have been good.  Patient denies any difficulty with concentration.  Patient denies any suicidal ideation. Miranda Norman a pt here and unsure what's going on with him but has affected her.  He has some confusion which is difficult for her bc of her prior psychosis. He will hallucinate and lose things. Miranda Norman lives with her and father lives on a farm alone. No Ativan since Jan. SE occ nausea  Some anxiety and conflict over faith and prior psychosis.    Past Psychiatric Medication Trials: Latuda 80- 2017,  risperidone 2 mg twice daily high prolactin,  Abilify failed transition from risperidone  Wellbutrin 450, Lexapro 7.5, sertraline, lorazepam History of psychiatric hospitalization.  Review of Systems:  Review of Systems  Constitutional:  Negative for fever.  Cardiovascular:  Negative for palpitations.  Gastrointestinal:  Negative for nausea.  Musculoskeletal:  Negative for gait problem.  Neurological:  Negative for dizziness, tremors and weakness.  Psychiatric/Behavioral:  The patient is nervous/anxious.        Please refer to HPI   Medications: I have reviewed the  patient's current medications.  Current Outpatient Medications  Medication Sig Dispense Refill   buPROPion (WELLBUTRIN XL) 300 MG 24 hr tablet Take 1 tablet (300 mg total) by mouth daily. 90 tablet 1   Lurasidone HCl (LATUDA) 120 MG TABS Take 1 tablet (120 mg total) by mouth daily with supper. 90 tablet 1   LORazepam (ATIVAN) 0.5 MG tablet Take 1 tablet (0.5 mg total) by mouth every 8 (eight) hours as needed for anxiety. (Patient not taking: Reported on 03/04/2020) 60 tablet 1   No current facility-administered medications for this visit.    Medication Side Effects: None  Allergies:  Allergies  Allergen Reactions   Ceclor [Cefaclor]     Doesn't remember. Childhood reaction   Erythromycin     Doesn't remember. Childhood reaction   Penicillins Hives    Past Medical History:  Diagnosis Date   CMV (cytomegalovirus) (HCC) 2006   Hyperprolactinemia (HCC)    Thyroid goiter 4 years ago   "everything is normal"    Family History  Problem Relation Age of Onset  Asthma Mother    Goiter Other    Other Neg Hx        hyperprolactinemia    Social History   Socioeconomic History   Marital status: Single    Spouse name: Not on file   Number of children: Not on file   Years of education: Not on file   Highest education level: Not on file  Occupational History   Not on file  Tobacco Use   Smoking status: Never   Smokeless tobacco: Never  Substance and Sexual Activity   Alcohol use: Yes    Comment: occasional   Drug use: No   Sexual activity: Not on file  Other Topics Concern   Not on file  Social History Narrative   Not on file   Social Determinants of Health   Financial Resource Strain: Not on file  Food Insecurity: Not on file  Transportation Needs: Not on file  Physical Activity: Not on file  Stress: Not on file  Social Connections: Not on file  Intimate Partner Violence: Not on file    Past Medical History, Surgical history, Social history, and Family history  were reviewed and updated as appropriate.   Please see review of systems for further details on the patient's review from today.   Objective:   Physical Exam:  There were no vitals taken for this visit.  Physical Exam Constitutional:      General: She is not in acute distress.    Appearance: She is well-developed.  Musculoskeletal:        General: No deformity.  Neurological:     Mental Status: She is alert and oriented to person, place, and time.     Cranial Nerves: No dysarthria.     Coordination: Coordination normal.  Psychiatric:        Attention and Perception: She is attentive. She does not perceive auditory hallucinations.        Mood and Affect: Mood is anxious. Mood is not depressed. Affect is not labile, blunt, angry, tearful or inappropriate.        Speech: Speech normal. Speech is not slurred.        Behavior: Behavior normal.        Thought Content: Thought content is not paranoid or delusional. Thought content does not include homicidal or suicidal ideation.        Cognition and Memory: Cognition normal.        Judgment: Judgment normal.     Comments: Insight intact. Paranoia resolved after increase Latuda.    Lab Review:     Component Value Date/Time   NA 138 04/04/2013 0915   K 4.3 04/04/2013 0915   CL 101 04/04/2013 0915   CO2 25 04/04/2013 0915   GLUCOSE 94 04/04/2013 0915   BUN 10 04/04/2013 0915   CREATININE 0.67 04/04/2013 0915   CALCIUM 9.3 04/04/2013 0915   PROT 6.9 04/04/2013 0915   ALBUMIN 3.8 04/04/2013 0915   AST 27 04/04/2013 0915   ALT 20 04/04/2013 0915   ALKPHOS 54 04/04/2013 0915   BILITOT 0.4 04/04/2013 0915   GFRNONAA >90 04/04/2013 0915   GFRAA >90 04/04/2013 0915       Component Value Date/Time   WBC 7.5 04/04/2013 0915   RBC 5.07 04/04/2013 0915   HGB 14.9 04/04/2013 0915   HCT 42.9 04/04/2013 0915   PLT PLATELET CLUMPS NOTED ON SMEAR, UNABLE TO ESTIMATE 04/04/2013 0915   MCV 84.6 04/04/2013 0915   MCH 29.4 04/04/2013  0915  MCHC 34.7 04/04/2013 0915   RDW 13.0 04/04/2013 0915   LYMPHSABS 1.8 04/04/2013 0915   MONOABS 0.4 04/04/2013 0915   EOSABS 0.2 04/04/2013 0915   BASOSABS 0.0 04/04/2013 0915    No results found for: POCLITH, LITHIUM   No results found for: PHENYTOIN, PHENOBARB, VALPROATE, CBMZ   .res Assessment: Plan:    Recurrent major depression in full remission (HCC)  Psychosis, unspecified psychosis type (HCC)   Continue Latuda 120 mg daily with meal 350 calories emphasized. It resolved recent paranoia in December 2021.  Disc ways to deal with missed calories and Latuda if that occurs.  Call if questions Disc timing of Latuda  Disc good prognosis with good response to meds.  No mood swings.    Was very situationally depressed at the time of starting Wellbutrin. Continue Wellbutrin 300 mg daily.  Discussed potential metabolic side effects associated with atypical antipsychotics, as well as potential risk for movement side effects. Advised pt to contact office if movement side effects occur.  No evidence of TD  No med changes indicated  FU  6 mos  Meredith Staggers, MD, DFAPA   Please see After Visit Summary for patient specific instructions.  No future appointments.   No orders of the defined types were placed in this encounter.     -------------------------------

## 2020-12-18 ENCOUNTER — Telehealth: Payer: Self-pay

## 2020-12-18 NOTE — Telephone Encounter (Signed)
Prior Authorization submitted and approved for LATUDA 120 MG effective 12/18/2020-12/18/2021 with [facility] [id number]

## 2021-05-01 ENCOUNTER — Telehealth: Payer: Self-pay | Admitting: Psychiatry

## 2021-05-01 NOTE — Telephone Encounter (Signed)
Mother of patient, Randa Evens, had asked about the cost of the lurasidone for Miranda Norman. They recently had to pay $124.08 for a three month supply of lurasidone. I inquired with [facility]- the PA approves the generic and [facility] confirmed that the copay is $125/ 30 day supply. There is not tier exceptions available. Apolonio Schneiders is a $1600 copay, so it is much more expensive. ?Notified mother of this information. ?

## 2021-06-17 ENCOUNTER — Encounter: Payer: Self-pay | Admitting: Psychiatry

## 2021-06-17 ENCOUNTER — Ambulatory Visit (INDEPENDENT_AMBULATORY_CARE_PROVIDER_SITE_OTHER): Payer: 59 | Admitting: Psychiatry

## 2021-06-17 DIAGNOSIS — Z8659 Personal history of other mental and behavioral disorders: Secondary | ICD-10-CM

## 2021-06-17 DIAGNOSIS — F29 Unspecified psychosis not due to a substance or known physiological condition: Secondary | ICD-10-CM | POA: Diagnosis not present

## 2021-06-17 DIAGNOSIS — F3342 Major depressive disorder, recurrent, in full remission: Secondary | ICD-10-CM

## 2021-06-17 MED ORDER — BUPROPION HCL ER (XL) 300 MG PO TB24: 300.0000 mg | ORAL_TABLET | Freq: Every day | ORAL | 1 refills | Status: DC

## 2021-06-17 MED ORDER — LURASIDONE HCL 120 MG PO TABS: 120.0000 mg | ORAL_TABLET | Freq: Every day | ORAL | 1 refills | Status: DC

## 2021-06-17 NOTE — Progress Notes (Signed)
Miranda Norman [id number] 12-May-1980 41 y.o.  Subjective:   Patient ID:  Miranda Norman is a 41 y.o. (DOB 04-11-1980) female.  Chief Complaint:  Chief Complaint  Patient presents with   Follow-up   Depression    Depression        Miranda Norman presents to the office today for follow-up of a psychotic disorder, NOS and major depression.  10/2019 appt..  No med changes were made and she was doing well on her medications. Remained on Wellbutrin 450 and Latuda 80.  Covid vaccinated.  12/27/2019 phone call after hours: Patient started having hallucinations of voices talking about Satan and Jesus from the TV.  She had been consistent with her medication including Wellbutrin and Latuda 80 mg daily.  It was suggested she increase Latuda to 120 mg or 160 mg until she could get in with Dr. Charlott Holler.  She was feeling very anxious and was prescribed lorazepam.  01/03/2020 appointment urgently scheduled due to recent recurrence of psychosis.  The following was noted: Weird things happened with song on TV that seemed to talk about [name] and God.  Was getting confused by other things she heard and saw on TV.  Callled office to increase latuda to 120 mg daily and RX Ativan 0.5 mg TID prn.  Didn't have the drug card or right dose to get 120 mg.  Started 120 mg 5 days ago.   Hard to say what's going on bc has been very sheltered since that time.  Was fine when people were with her that she knows well.  Turned off the TV and radio to limit stimuli.   Covid booster Friday 12/10 and felt bad after it for a couple of days.   Had episode where she felt like she was fainting and might die and go to hell.   No other occ of unusual missing meds or not taking it with food.   No preexisting problems with sleep or depression.  Some work stress lately.  Usually mother stays with her and for a couple of months has been more alone.   Sleepy with increase Latuda to 120 mg.  Some sleep to escape.    Scared of  recurrent psychosis and rehospitalization.  Taking Latuda with dinner.  Usually bed 9 and up at 6.  Latey more sleep. Not worked this week. Started [name] hormone October 2020 Lo Loestrin FE Has notes. Plan: Agree with increase to Latuda 120 mg daily. Reduce Wellbutrin to 300 mg bc activation may make sx worse.   01/12/2020 urgent appointment to follow up relapse psychosis with following noted: Urgent appointment made today.  Because of recent psychosis. I've been good.  Back to work Monday and it went OK.  Kind of a normal week.   Anxiety is better.  Tolerates Ativan 1 mg AM.  Sleep good. No paranoid thoughts since last here.  No AH.  Not depressed.  No panic.   Tolerates meds except mild nausea after eating pm briefly. Mild sleepiness. Patient reports stable mood and denies depressed or irritable moods.  Patient denies any recent difficulty with anxiety.  Patient denies difficulty with sleep initiation or maintenance. Denies appetite disturbance.  Patient reports that energy and motivation have been good.  Patient denies any difficulty with concentration.  Patient denies any suicidal ideation.  Plan: no med changes  03/04/20 appt wit following noted: Good.  Mood and anxiety good.  No psychosis since here.  Good work function.   Tolerates meds  well.   Mild nausea not bad lately. Patient reports stable mood and denies depressed or irritable moods.  Patient denies any recent difficulty with anxiety.  Patient denies difficulty with sleep initiation or maintenance. Denies appetite disturbance.  Patient reports that energy and motivation have been good.  Patient denies any difficulty with concentration.  Patient denies any suicidal ideation. Plan no changes  6/22 /2022 appointment with the following noted: Still good with meds and mood. Mild nausea otherwise no SE. Latuda with a meal. Patient reports stable mood and denies depressed or irritable moods.  Patient denies any recent difficulty with  anxiety.  Patient denies difficulty with sleep initiation or maintenance. Denies appetite disturbance.  Patient reports that energy and motivation have been good.  Patient denies any difficulty with concentration.  Patient denies any suicidal ideation.  Interest in rare plants. Plan: Continue Wellbutrin 300 mg every morning Continue Latuda 120 mg daily Continue lorazepam as needed  12/12/2020 appointment with the following noted: Remains on Wellbutrin 300 mg every morning and Latuda 120 mg daily.  Not taking lorazepam lately. Good still.   Patient reports stable mood and denies depressed or irritable moods.  Patient denies any recent difficulty with anxiety.  Patient denies difficulty with sleep initiation or maintenance. Denies appetite disturbance.  Patient reports that energy and motivation have been good.  Patient denies any difficulty with concentration.  Patient denies any suicidal ideation. Dad a pt here and unsure what's going on with him but has affected her.  He has some confusion which is difficult for her bc of her prior psychosis. He will hallucinate and lose things. M lives with her and father lives on a farm alone. No Ativan since Jan. SE occ nausea  06/17/21 appt noted: Still doing well.  Remains on Wellbutrin 300 mg every morning and Latuda 120 mg daily.  With land trust for 17 years.   More expensive with generic Latuda.   No SE except occ mild nausea. Patient reports stable mood and denies depressed or irritable moods.  Patient denies any recent difficulty with anxiety.  Patient denies difficulty with sleep initiation or maintenance. Denies appetite disturbance.  Patient reports that energy and motivation have been good.  Patient denies any difficulty with concentration.  Patient denies any suicidal ideation. No paranoia.    Some anxiety and conflict over faith and prior psychosis.    Past Psychiatric Medication Trials: Latuda 80- 2017,  risperidone 2 mg twice daily high  prolactin,  Abilify failed transition from risperidone  Wellbutrin 450, Lexapro 7.5, sertraline, lorazepam History of psychiatric hospitalization.  Review of Systems:  Review of Systems  Constitutional:  Negative for fever.  Gastrointestinal:  Negative for nausea.  Musculoskeletal:  Negative for gait problem.  Neurological:  Negative for dizziness, tremors and weakness.  Psychiatric/Behavioral:  The patient is not nervous/anxious.        Please refer to HPI    Medications: I have reviewed the patient's current medications.  Current Outpatient Medications  Medication Sig Dispense Refill   buPROPion (WELLBUTRIN XL) 300 MG 24 hr tablet Take 1 tablet (300 mg total) by mouth daily. 90 tablet 1   LORazepam (ATIVAN) 0.5 MG tablet Take 1 tablet (0.5 mg total) by mouth every 8 (eight) hours as needed for anxiety. (Patient not taking: Reported on 03/04/2020) 60 tablet 1   Lurasidone HCl (LATUDA) 120 MG TABS Take 1 tablet (120 mg total) by mouth daily with supper. 90 tablet 1   No current facility-administered medications for  this visit.    Medication Side Effects: None  Allergies:  Allergies  Allergen Reactions   Ceclor [Cefaclor]     Doesn't remember. Childhood reaction   Erythromycin     Doesn't remember. Childhood reaction   Penicillins Hives    Past Medical History:  Diagnosis Date   CMV (cytomegalovirus) ([place]) 2006   Hyperprolactinemia ([place])    Thyroid goiter 4 years ago   "everything is normal"    Family History  Problem Relation Age of Onset   Asthma Mother    Goiter Other    Other Neg Hx        hyperprolactinemia    Social History   Socioeconomic History   Marital status: Single    Spouse name: Not on file   Number of children: Not on file   Years of education: Not on file   Highest education level: Not on file  Occupational History   Not on file  Tobacco Use   Smoking status: Never   Smokeless tobacco: Never  Substance and Sexual Activity   Alcohol  use: Yes    Comment: occasional   Drug use: No   Sexual activity: Not on file  Other Topics Concern   Not on file  Social History Narrative   Not on file   Social Determinants of Health   Financial Resource Strain: Not on file  Food Insecurity: Not on file  Transportation Needs: Not on file  Physical Activity: Not on file  Stress: Not on file  Social Connections: Not on file  Intimate Partner Violence: Not on file    Past Medical History, Surgical history, Social history, and Family history were reviewed and updated as appropriate.   Please see review of systems for further details on the patient's review from today.   Objective:   Physical Exam:  There were no vitals taken for this visit.  Physical Exam Constitutional:      General: She is not in acute distress.    Appearance: She is well-developed.  Musculoskeletal:        General: No deformity.  Neurological:     Mental Status: She is alert and oriented to person, place, and time.     Cranial Nerves: No dysarthria.     Coordination: Coordination normal.  Psychiatric:        Attention and Perception: She is attentive. She does not perceive auditory hallucinations.        Mood and Affect: Mood is not anxious or depressed. Affect is not labile, blunt, angry, tearful or inappropriate.        Speech: Speech normal. Speech is not slurred.        Behavior: Behavior normal.        Thought Content: Thought content is not paranoid or delusional. Thought content does not include homicidal or suicidal ideation.        Cognition and Memory: Cognition normal.        Judgment: Judgment normal.     Comments: Insight intact. Paranoia resolved after increase Latuda. Pleasant and upbeat.       Lab Review:     Component Value Date/Time   NA 138 04/04/2013 0915   K 4.3 04/04/2013 0915   CL 101 04/04/2013 0915   CO2 25 04/04/2013 0915   GLUCOSE 94 04/04/2013 0915   BUN 10 04/04/2013 0915   CREATININE 0.67 04/04/2013 0915    CALCIUM 9.3 04/04/2013 0915   PROT 6.9 04/04/2013 0915   ALBUMIN 3.8 04/04/2013 0915  AST 27 04/04/2013 0915   ALT 20 04/04/2013 0915   ALKPHOS 54 04/04/2013 0915   BILITOT 0.4 04/04/2013 0915   GFRNONAA >90 04/04/2013 0915   GFRAA >90 04/04/2013 0915       Component Value Date/Time   WBC 7.5 04/04/2013 0915   RBC 5.07 04/04/2013 0915   HGB 14.9 04/04/2013 0915   HCT 42.9 04/04/2013 0915   PLT PLATELET CLUMPS NOTED ON SMEAR, UNABLE TO ESTIMATE 04/04/2013 0915   MCV 84.6 04/04/2013 0915   MCH 29.4 04/04/2013 0915   MCHC 34.7 04/04/2013 0915   RDW 13.0 04/04/2013 0915   LYMPHSABS 1.8 04/04/2013 0915   MONOABS 0.4 04/04/2013 0915   EOSABS 0.2 04/04/2013 0915   BASOSABS 0.0 04/04/2013 0915    No results found for: "POCLITH", "LITHIUM"   No results found for: "PHENYTOIN", "PHENOBARB", "VALPROATE", "CBMZ"   .res Assessment: Plan:    Recurrent major 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 It resolved  paranoia in December 2021.  Disc ways to deal with missed calories and Latuda if that occurs.  Call if questions Disc timing of Latuda  Disc good prognosis with good response to meds.  No mood swings.    Was very situationally depressed at the time of starting Wellbutrin. Continue Wellbutrin 300 mg daily.  Discussed potential metabolic side effects associated with atypical antipsychotics, as well as potential risk for movement side effects. Advised pt to contact office if movement side effects occur.  No evidence of TD  No med changes indicated Disc generic.  FU  6 mos  Lynder Parents, MD, DFAPA   Please see After Visit Summary for patient specific instructions.  No future appointments.   No orders of the  defined types were placed in this encounter.     -------------------------------

## 2021-12-18 ENCOUNTER — Ambulatory Visit: Payer: 59 | Admitting: Psychiatry

## 2021-12-18 ENCOUNTER — Encounter: Payer: Self-pay | Admitting: Psychiatry

## 2021-12-18 DIAGNOSIS — F3342 Major depressive disorder, recurrent, in full remission: Secondary | ICD-10-CM | POA: Diagnosis not present

## 2021-12-18 DIAGNOSIS — Z8659 Personal history of other mental and behavioral disorders: Secondary | ICD-10-CM | POA: Diagnosis not present

## 2021-12-18 DIAGNOSIS — F29 Unspecified psychosis not due to a substance or known physiological condition: Secondary | ICD-10-CM | POA: Diagnosis not present

## 2021-12-18 MED ORDER — LURASIDONE HCL 120 MG PO TABS: 120.0000 mg | ORAL_TABLET | Freq: Every day | ORAL | 3 refills | Status: DC

## 2021-12-18 MED ORDER — BUPROPION HCL ER (XL) 300 MG PO TB24: 300.0000 mg | ORAL_TABLET | Freq: Every day | ORAL | 3 refills | Status: DC

## 2021-12-18 NOTE — Progress Notes (Signed)
Miranda Norman [id number] 12-May-1980 41 y.o.  Subjective:   Patient ID:  Miranda Norman is a 41 y.o. (DOB 04-11-1980) female.  Chief Complaint:  Chief Complaint  Patient presents with   Follow-up   Depression    Depression        Miranda Norman presents to the office today for follow-up of a psychotic disorder, NOS and major depression.  10/2019 appt..  No med changes were made and she was doing well on her medications. Remained on Wellbutrin 450 and Latuda 80.  Covid vaccinated.  12/27/2019 phone call after hours: Patient started having hallucinations of voices talking about Satan and Jesus from the TV.  She had been consistent with her medication including Wellbutrin and Latuda 80 mg daily.  It was suggested she increase Latuda to 120 mg or 160 mg until she could get in with Miranda Norman.  She was feeling very anxious and was prescribed lorazepam.  01/03/2020 appointment urgently scheduled due to recent recurrence of psychosis.  The following was noted: Weird things happened with song on TV that seemed to talk about Miranda Norman and God.  Was getting confused by other things she heard and saw on TV.  Callled office to increase latuda to 120 mg daily and RX Ativan 0.5 mg TID prn.  Didn't have the drug card or right dose to get 120 mg.  Started 120 mg 5 days ago.   Hard to say what's going on bc has been very sheltered since that time.  Was fine when people were with her that she knows well.  Turned off the TV and radio to limit stimuli.   Covid booster Friday 12/10 and felt bad after it for a couple of days.   Had episode where she felt like she was fainting and might die and go to hell.   No other occ of unusual missing meds or not taking it with food.   No preexisting problems with sleep or depression.  Some work stress lately.  Usually mother stays with her and for a couple of months has been more alone.   Sleepy with increase Latuda to 120 mg.  Some sleep to escape.    Scared of  recurrent psychosis and rehospitalization.  Taking Latuda with dinner.  Usually bed 9 and up at 6.  Latey more sleep. Not worked this week. Started [name] hormone October 2020 Lo Loestrin FE Has notes. Plan: Agree with increase to Latuda 120 mg daily. Reduce Wellbutrin to 300 mg bc activation may make sx worse.   01/12/2020 urgent appointment to follow up relapse psychosis with following noted: Urgent appointment made today.  Because of recent psychosis. I've been good.  Back to work Monday and it went OK.  Kind of a normal week.   Anxiety is better.  Tolerates Ativan 1 mg AM.  Sleep good. No paranoid thoughts since last here.  No AH.  Not depressed.  No panic.   Tolerates meds except mild nausea after eating pm briefly. Mild sleepiness. Patient reports stable mood and denies depressed or irritable moods.  Patient denies any recent difficulty with anxiety.  Patient denies difficulty with sleep initiation or maintenance. Denies appetite disturbance.  Patient reports that energy and motivation have been good.  Patient denies any difficulty with concentration.  Patient denies any suicidal ideation.  Plan: no med changes  03/04/20 appt wit following noted: Good.  Mood and anxiety good.  No psychosis since here.  Good work function.   Tolerates meds  well.   Mild nausea not bad lately. Patient reports stable mood and denies depressed or irritable moods.  Patient denies any recent difficulty with anxiety.  Patient denies difficulty with sleep initiation or maintenance. Denies appetite disturbance.  Patient reports that energy and motivation have been good.  Patient denies any difficulty with concentration.  Patient denies any suicidal ideation. Plan no changes  6/22 /2022 appointment with the following noted: Still good with meds and mood. Mild nausea otherwise no SE. Latuda with a meal. Patient reports stable mood and denies depressed or irritable moods.  Patient denies any recent difficulty with  anxiety.  Patient denies difficulty with sleep initiation or maintenance. Denies appetite disturbance.  Patient reports that energy and motivation have been good.  Patient denies any difficulty with concentration.  Patient denies any suicidal ideation.  Interest in rare plants. Plan: Continue Wellbutrin 300 mg every morning Continue Latuda 120 mg daily Continue lorazepam as needed  12/12/2020 appointment with the following noted: Remains on Wellbutrin 300 mg every morning and Latuda 120 mg daily.  Not taking lorazepam lately. Good still.   Patient reports stable mood and denies depressed or irritable moods.  Patient denies any recent difficulty with anxiety.  Patient denies difficulty with sleep initiation or maintenance. Denies appetite disturbance.  Patient reports that energy and motivation have been good.  Patient denies any difficulty with concentration.  Patient denies any suicidal ideation. Dad a pt here and unsure what's going on with him but has affected her.  He has some confusion which is difficult for her bc of her prior psychosis. He will hallucinate and lose things. M lives with her and father lives on a farm alone. No Ativan since Jan. SE occ nausea  06/17/21 appt noted: Still doing well.  Remains on Wellbutrin 300 mg every morning and Latuda 120 mg daily.  With land trust for 17 years.   More expensive with generic Latuda.   No SE except occ mild nausea. Patient reports stable mood and denies depressed or irritable moods.  Patient denies any recent difficulty with anxiety.  Patient denies difficulty with sleep initiation or maintenance. Denies appetite disturbance.  Patient reports that energy and motivation have been good.  Patient denies any difficulty with concentration.  Patient denies any suicidal ideation. No paranoia.    12/18/21 appt noted: Continued previous meds and consistent. Stable dose 2 years. No ativan used. Remained well since here.  Anxiety is pretty good but  end o fyear stress.   Still enjoys her job. SE less nausea bu some sleepiness but less than risperidone did in the past.  Latuda in the PM meal.  No unusual fear.  Sleep is good.  No paranoia. No change indicated.  Some anxiety and conflict over faith and prior psychosis.    Past Psychiatric Medication Trials: Latuda 80- 2017,  risperidone 2 mg twice daily high prolactin,  Abilify failed transition from risperidone  Wellbutrin 450, Lexapro 7.5, sertraline,  lorazepam History of psychiatric hospitalization.  Review of Systems:  Review of Systems  Constitutional:  Negative for fever.  Gastrointestinal:  Negative for nausea.  Musculoskeletal:  Negative for gait problem.  Neurological:  Negative for dizziness, tremors and weakness.  Psychiatric/Behavioral:  Negative for confusion. The patient is not nervous/anxious.        Please refer to HPI    Medications: I have reviewed the patient's current medications.  Current Outpatient Medications  Medication Sig Dispense Refill   buPROPion (WELLBUTRIN XL) 300 MG 24 hr  tablet Take 1 tablet (300 mg total) by mouth daily. 90 tablet 3   LORazepam (ATIVAN) 0.5 MG tablet Take 1 tablet (0.5 mg total) by mouth every 8 (eight) hours as needed for anxiety. (Patient not taking: Reported on 03/04/2020) 60 tablet 1   Lurasidone HCl (LATUDA) 120 MG TABS Take 1 tablet (120 mg total) by mouth daily with supper. 90 tablet 3   No current facility-administered medications for this visit.    Medication Side Effects: None  Allergies:  Allergies  Allergen Reactions   Ceclor [Cefaclor]     Doesn't remember. Childhood reaction   Erythromycin     Doesn't remember. Childhood reaction   Penicillins Hives    Past Medical History:  Diagnosis Date   CMV (cytomegalovirus) ([facility]) 2006   Hyperprolactinemia ([facility])    Thyroid goiter 4 years ago   "everything is normal"    Family History  Problem Relation Age of Onset   Asthma Mother    Goiter Other     Other Neg Hx        hyperprolactinemia    Social History   Socioeconomic History   Marital status: Single    Spouse name: Not on file   Number of children: Not on file   Years of education: Not on file   Highest education level: Not on file  Occupational History   Not on file  Tobacco Use   Smoking status: Never   Smokeless tobacco: Never  Substance and Sexual Activity   Alcohol use: Yes    Comment: occasional   Drug use: No   Sexual activity: Not on file  Other Topics Concern   Not on file  Social History Narrative   Not on file   Social Determinants of Health   Financial Resource Strain: Not on file  Food Insecurity: Not on file  Transportation Needs: Not on file  Physical Activity: Not on file  Stress: Not on file  Social Connections: Not on file  Intimate Partner Violence: Not on file    Past Medical History, Surgical history, Social history, and Family history were reviewed and updated as appropriate.   Please see review of systems for further details on the patient's review from today.   Objective:   Physical Exam:  There were no vitals taken for this visit.  Physical Exam Constitutional:      General: She is not in acute distress.    Appearance: She is well-developed.  Musculoskeletal:        General: No deformity.  Neurological:     Mental Status: She is alert and oriented to person, place, and time.     Cranial Nerves: No dysarthria.     Coordination: Coordination normal.  Psychiatric:        Attention and Perception: She is attentive. She does not perceive auditory hallucinations.        Mood and Affect: Mood is not anxious or depressed. Affect is not labile, blunt, angry or tearful.        Speech: Speech normal. Speech is not slurred.        Behavior: Behavior normal.        Thought Content: Thought content is not paranoid or delusional. Thought content does not include homicidal or suicidal ideation.        Cognition and Memory: Cognition  normal.        Judgment: Judgment normal.     Comments: Insight intact. Paranoia resolved after increase Latuda 2021 Pleasant and upbeat.  Lab Review:     Component Value Date/Time   NA 138 04/04/2013 0915   K 4.3 04/04/2013 0915   CL 101 04/04/2013 0915   CO2 25 04/04/2013 0915   GLUCOSE 94 04/04/2013 0915   BUN 10 04/04/2013 0915   CREATININE 0.67 04/04/2013 0915   CALCIUM 9.3 04/04/2013 0915   PROT 6.9 04/04/2013 0915   ALBUMIN 3.8 04/04/2013 0915   AST 27 04/04/2013 0915   ALT 20 04/04/2013 0915   ALKPHOS 54 04/04/2013 0915   BILITOT 0.4 04/04/2013 0915   GFRNONAA >90 04/04/2013 0915   GFRAA >90 04/04/2013 0915       Component Value Date/Time   WBC 7.5 04/04/2013 0915   RBC 5.07 04/04/2013 0915   HGB 14.9 04/04/2013 0915   HCT 42.9 04/04/2013 0915   PLT PLATELET CLUMPS NOTED ON SMEAR, UNABLE TO ESTIMATE 04/04/2013 0915   MCV 84.6 04/04/2013 0915   MCH 29.4 04/04/2013 0915   MCHC 34.7 04/04/2013 0915   RDW 13.0 04/04/2013 0915   LYMPHSABS 1.8 04/04/2013 0915   MONOABS 0.4 04/04/2013 0915   EOSABS 0.2 04/04/2013 0915   BASOSABS 0.0 04/04/2013 0915    No results found for: "POCLITH", "LITHIUM"   No results found for: "PHENYTOIN", "PHENOBARB", "VALPROATE", "CBMZ"   .res Assessment: Plan:    Recurrent major depression in full remission ([facility]) - Plan: buPROPion (WELLBUTRIN XL) 300 MG 24 hr tablet, Lurasidone HCl (LATUDA) 120 MG TABS  History of psychosis  Psychosis, unspecified psychosis type ([facility]) - Plan: Lurasidone HCl (LATUDA) 120 MG TABS   Continue Latuda 120 mg daily with meal 350 calories emphasized. It resolved  paranoia in December 2021.  Disc ways to deal with missed calories and Latuda if that occurs.  Call if questions Disc timing of Latuda  Disc good prognosis with good response to meds.  No mood swings.    Was very situationally depressed at the time of starting Wellbutrin. Continue Wellbutrin 300 mg daily.  Discussed potential  metabolic side effects associated with atypical antipsychotics, as well as potential risk for movement side effects. Advised pt to contact office if movement side effects occur.  No evidence of TD  No med changes indicated Disc generic.  FU  9 mos bc is more stable for a couple of years  Meredith Staggers, MD, DFAPA   Please see After Visit Summary for patient specific instructions.  No future appointments.   No orders of the defined types were placed in this encounter.     -------------------------------

## 2022-04-24 ENCOUNTER — Telehealth: Payer: Self-pay | Admitting: Psychiatry

## 2022-04-24 NOTE — Telephone Encounter (Signed)
TC to patient.  She's had no SE but wonders if doubled Latuda 120 RX.  Disc risk palpitations and EPS and what do if they occur.  No SX or SE now

## 2022-04-24 NOTE — Telephone Encounter (Signed)
Next appt is 06/22/22. Miranda Norman called asking if she should be concerned with accidentally taking two 120 grams of Latuda. She knows she took one but doesn't know about the 2nd one. Her number is [phone].

## 2022-04-24 NOTE — Telephone Encounter (Signed)
Patient thinks she may have taken two 120 mg Latuda. Tried calling patient, had to LM so no details. She isn't sure she took 2.

## 2022-09-22 ENCOUNTER — Ambulatory Visit: Payer: 59 | Admitting: Psychiatry

## 2022-09-22 ENCOUNTER — Encounter: Payer: Self-pay | Admitting: Psychiatry

## 2022-09-22 DIAGNOSIS — F3342 Major depressive disorder, recurrent, in full remission: Secondary | ICD-10-CM

## 2022-09-22 DIAGNOSIS — Z8659 Personal history of other mental and behavioral disorders: Secondary | ICD-10-CM

## 2022-09-22 DIAGNOSIS — F29 Unspecified psychosis not due to a substance or known physiological condition: Secondary | ICD-10-CM

## 2022-09-22 MED ORDER — LORAZEPAM 0.5 MG PO TABS: 0.5000 mg | ORAL_TABLET | Freq: Three times a day (TID) | ORAL | 1 refills | Status: AC | PRN

## 2022-09-22 MED ORDER — BUPROPION HCL ER (XL) 300 MG PO TB24: 300.0000 mg | ORAL_TABLET | Freq: Every day | ORAL | 3 refills | Status: DC

## 2022-09-22 MED ORDER — LURASIDONE HCL 120 MG PO TABS: 120.0000 mg | ORAL_TABLET | Freq: Every day | ORAL | 3 refills | Status: DC

## 2022-09-22 NOTE — Progress Notes (Signed)
LEONE KING [id number] [birth year] 42 y.o.  Subjective:   Patient ID:  Miranda Norman is a 42 y.o. (DOB 1980/01/24) female.  Chief Complaint:  Chief Complaint  Patient presents with   Follow-up    Depression        Marybel J Turrell presents to the office today for follow-up of a psychotic disorder, NOS and major depression.  10/2019 appt..  No med changes were made and she was doing well on her medications. Remained on Wellbutrin 450 and Latuda 80.  Covid vaccinated.  12/27/2019 phone call after hours: Patient started having hallucinations of voices talking about Satan and Jesus from the TV.  She had been consistent with her medication including Wellbutrin and Latuda 80 mg daily.  It was suggested she increase Latuda to 120 mg or 160 mg until she could get in with Dr. Haywood Lasso.  She was feeling very anxious and was prescribed lorazepam.  01/03/2020 appointment urgently scheduled due to recent recurrence of psychosis.  The following was noted: Weird things happened with song on TV that seemed to talk about Prudy Feeler and God.  Was getting confused by other things she heard and saw on TV.  Callled office to increase latuda to 120 mg daily and RX Ativan 0.5 mg TID prn.  Didn't have the drug card or right dose to get 120 mg.  Started 120 mg 5 days ago.   Hard to say what's going on bc has been very sheltered since that time.  Was fine when people were with her that she knows well.  Turned off the TV and radio to limit stimuli.   Covid booster Friday 12/10 and felt bad after it for a couple of days.   Had episode where she felt like she was fainting and might die and go to hell.   No other occ of unusual missing meds or not taking it with food.   No preexisting problems with sleep or depression.  Some work stress lately.  Usually mother stays with her and for a couple of months has been more alone.   Sleepy with increase Latuda to 120 mg.  Some sleep to escape.    Scared of recurrent  psychosis and rehospitalization.  Taking Latuda with dinner.  Usually bed 9 and up at 6.  Latey more sleep. Not worked this week. Started [facility] hormone October 2020 Lo Loestrin FE Has notes. Plan: Agree with increase to Latuda 120 mg daily. Reduce Wellbutrin to 300 mg bc activation may make sx worse.   01/12/2020 urgent appointment to follow up relapse psychosis with following noted: Urgent appointment made today.  Because of recent psychosis. I've been good.  Back to work Monday and it went OK.  Kind of a normal week.   Anxiety is better.  Tolerates Ativan 1 mg AM.  Sleep good. No paranoid thoughts since last here.  No AH.  Not depressed.  No panic.   Tolerates meds except mild nausea after eating pm briefly. Mild sleepiness. Patient reports stable mood and denies depressed or irritable moods.  Patient denies any recent difficulty with anxiety.  Patient denies difficulty with sleep initiation or maintenance. Denies appetite disturbance.  Patient reports that energy and motivation have been good.  Patient denies any difficulty with concentration.  Patient denies any suicidal ideation.  Plan: no med changes  03/04/20 appt wit following noted: Good.  Mood and anxiety good.  No psychosis since here.  Good work function.   Tolerates meds well.  Mild nausea not bad lately. Patient reports stable mood and denies depressed or irritable moods.  Patient denies any recent difficulty with anxiety.  Patient denies difficulty with sleep initiation or maintenance. Denies appetite disturbance.  Patient reports that energy and motivation have been good.  Patient denies any difficulty with concentration.  Patient denies any suicidal ideation. Plan no changes  6/22 /2022 appointment with the following noted: Still good with meds and mood. Mild nausea otherwise no SE. Latuda with a meal. Patient reports stable mood and denies depressed or irritable moods.  Patient denies any recent difficulty with anxiety.   Patient denies difficulty with sleep initiation or maintenance. Denies appetite disturbance.  Patient reports that energy and motivation have been good.  Patient denies any difficulty with concentration.  Patient denies any suicidal ideation.  Interest in rare plants. Plan: Continue Wellbutrin 300 mg every morning Continue Latuda 120 mg daily Continue lorazepam as needed  12/12/2020 appointment with the following noted: Remains on Wellbutrin 300 mg every morning and Latuda 120 mg daily.  Not taking lorazepam lately. Good still.   Patient reports stable mood and denies depressed or irritable moods.  Patient denies any recent difficulty with anxiety.  Patient denies difficulty with sleep initiation or maintenance. Denies appetite disturbance.  Patient reports that energy and motivation have been good.  Patient denies any difficulty with concentration.  Patient denies any suicidal ideation. Dad a pt here and unsure what's going on with him but has affected her.  He has some confusion which is difficult for her bc of her prior psychosis. He will hallucinate and lose things. M lives with her and father lives on a farm alone. No Ativan since Jan. SE occ nausea  06/17/21 appt noted: Still doing well.  Remains on Wellbutrin 300 mg every morning and Latuda 120 mg daily.  With land trust for 17 years.   More expensive with generic Latuda.   No SE except occ mild nausea. Patient reports stable mood and denies depressed or irritable moods.  Patient denies any recent difficulty with anxiety.  Patient denies difficulty with sleep initiation or maintenance. Denies appetite disturbance.  Patient reports that energy and motivation have been good.  Patient denies any difficulty with concentration.  Patient denies any suicidal ideation. No paranoia.    12/18/21 appt noted: Continued previous meds and consistent. Stable dose 2 years. No ativan used. Remained well since here.  Anxiety is pretty good but end o  fyear stress.   Still enjoys her job. SE less nausea bu some sleepiness but less than risperidone did in the past.  Latuda in the PM meal.  No unusual fear.  Sleep is good.  No paranoia. No change indicated. No med changes indicated Continue Latuda 120 mg daily and Wellbutrin 300 mg daily.  09/22/2022 appointment noted: Meds as above.  No SE usually except occ nausea.   Enjoys [place]aking[place]  [place]   GM 42 yo died today. Starting new job with [employer] and starts Oct 7.   Been with [employer] 18 years.  Will have better benefits.  Knows the people she will work with and for.  At peace with decision.   Mood has been good and stable.  No unusual fear or anxiety.   Sleep well.   Feb M major surgery for GI twisted and worked.  Doing better now.  But got so weak she fell and broke shoulder.  Relies on mother for a lot.  Past Psychiatric Medication  Trials: Latuda 80- 2017,  risperidone 2 mg twice daily high prolactin,  Abilify failed transition from risperidone  Wellbutrin 450 SE activation,  Lexapro 7.5, sertraline,  lorazepam History of psychiatric hospitalization with psychosis History Some anxiety and conflict over faith and prior psychosis.    Review of Systems:  Review of Systems  Constitutional:  Negative for fever.  Gastrointestinal:  Positive for nausea.  Musculoskeletal:  Negative for gait problem.  Neurological:  Negative for dizziness, tremors and weakness.  Psychiatric/Behavioral:  Negative for confusion. The patient is not nervous/anxious.        Please refer to HPI    Medications: I have reviewed the patient's current medications.  Current Outpatient Medications  Medication Sig Dispense Refill   buPROPion (WELLBUTRIN XL) 300 MG 24 hr tablet Take 1 tablet (300 mg total) by mouth daily. 90 tablet 3   celecoxib (CELEBREX) 200 MG capsule Take 200 mg by mouth daily.     Lurasidone HCl (LATUDA) 120 MG TABS Take 1 tablet (120 mg total) by mouth daily with supper.  90 tablet 3   LORazepam (ATIVAN) 0.5 MG tablet Take 1 tablet (0.5 mg total) by mouth every 8 (eight) hours as needed for anxiety. (Patient not taking: Reported on 09/22/2022) 60 tablet 1   No current facility-administered medications for this visit.    Medication Side Effects: None  Allergies:  Allergies  Allergen Reactions   Ceclor [Cefaclor]     Doesn't remember. Childhood reaction   Erythromycin     Doesn't remember. Childhood reaction   Penicillins Hives    Past Medical History:  Diagnosis Date   CMV (cytomegalovirus) (HCC) 2006   Hyperprolactinemia (HCC)    Thyroid goiter 4 years ago   "everything is normal"    Family History  Problem Relation Age of Onset   Asthma Mother    Goiter Other    Other Neg Hx        hyperprolactinemia    Social History   Socioeconomic History   Marital status: Single    Spouse name: Not on file   Number of children: Not on file   Years of education: Not on file   Highest education level: Not on file  Occupational History   Not on file  Tobacco Use   Smoking status: Never   Smokeless tobacco: Never  Substance and Sexual Activity   Alcohol use: Yes    Comment: occasional   Drug use: No   Sexual activity: Not on file  Other Topics Concern   Not on file  Social History Narrative   Not on file   Social Determinants of Health   Financial Resource Strain: Not on file  Food Insecurity: Low Risk  (08/31/2022)   Received from [facility]   Hunger Vital Sign    Worried About Running Out of Food in the Last Year: Never true    Ran Out of Food in the Last Year: Never true  Transportation Needs: No Transportation Needs (08/31/2022)   Received from [facility]    In the past 12 months, has lack of reliable transportation kept you from medical appointments, meetings, work or from getting things needed for daily living? : No  Physical Activity: Not on file  Stress: Not on file  Social Connections: Not on file   Intimate Partner Violence: Not on file    Past Medical History, Surgical history, Social history, and Family history were reviewed and updated as appropriate.   Please see review  of systems for further details on the patient's review from today.   Objective:   Physical Exam:  There were no vitals taken for this visit.  Physical Exam Constitutional:      General: She is not in acute distress.    Appearance: She is well-developed.  Musculoskeletal:        General: No deformity.  Neurological:     Mental Status: She is alert and oriented to person, place, and time.     Cranial Nerves: No dysarthria.     Coordination: Coordination normal.  Psychiatric:        Attention and Perception: She is attentive. She does not perceive auditory hallucinations.        Mood and Affect: Mood is not anxious or depressed. Affect is not labile, blunt, angry or tearful.        Speech: Speech normal.        Behavior: Behavior normal.        Thought Content: Thought content is not paranoid or delusional. Thought content does not include homicidal or suicidal ideation.        Cognition and Memory: Cognition normal.        Judgment: Judgment normal.     Comments: Insight intact. Paranoia resolved after increase Latuda 2021 Pleasant and upbeat.       Lab Review:     Component Value Date/Time   NA 138 04/04/2013 0915   K 4.3 04/04/2013 0915   CL 101 04/04/2013 0915   CO2 25 04/04/2013 0915   GLUCOSE 94 04/04/2013 0915   BUN 10 04/04/2013 0915   CREATININE 0.67 04/04/2013 0915   CALCIUM 9.3 04/04/2013 0915   PROT 6.9 04/04/2013 0915   ALBUMIN 3.8 04/04/2013 0915   AST 27 04/04/2013 0915   ALT 20 04/04/2013 0915   ALKPHOS 54 04/04/2013 0915   BILITOT 0.4 04/04/2013 0915   GFRNONAA >90 04/04/2013 0915   GFRAA >90 04/04/2013 0915       Component Value Date/Time   WBC 7.5 04/04/2013 0915   RBC 5.07 04/04/2013 0915   HGB 14.9 04/04/2013 0915   HCT 42.9 04/04/2013 0915   PLT PLATELET  CLUMPS NOTED ON SMEAR, UNABLE TO ESTIMATE 04/04/2013 0915   MCV 84.6 04/04/2013 0915   MCH 29.4 04/04/2013 0915   MCHC 34.7 04/04/2013 0915   RDW 13.0 04/04/2013 0915   LYMPHSABS 1.8 04/04/2013 0915   MONOABS 0.4 04/04/2013 0915   EOSABS 0.2 04/04/2013 0915   BASOSABS 0.0 04/04/2013 0915    No results found for: "POCLITH", "LITHIUM"   No results found for: "PHENYTOIN", "PHENOBARB", "VALPROATE", "CBMZ"   .res Assessment: Plan:    Recurrent major depression in full remission (HCC)  History of psychosis   Disc history of dep with psychosis well managed.  Continue Latuda 120 mg daily with meal 350 calories emphasized.  Option split dose if needed. It resolved  paranoia in December 2021.  Disc ways to deal with missed calories and Latuda if that occurs.  Call if questions Disc timing of Latuda  Disc good prognosis with good response to meds.  No mood swings.    Was very situationally depressed at the time of starting Wellbutrin. Continue Wellbutrin 300 mg daily.  Discussed potential metabolic side effects associated with atypical antipsychotics, as well as potential risk for movement side effects. Advised pt to contact office if movement side effects occur.  No evidence of TD  No Bz needed but woud like prn. We discussed the short-term risks  associated with benzodiazepines including sedation and increased fall risk among others.  Discussed long-term side effect risk including dependence, potential withdrawal symptoms, and the potential eventual dose-related risk of dementia.  But recent studies from 2020 dispute this association between benzodiazepines and dementia risk. Newer studies in 2020 do not support an association with dementia.  No med changes indicated Continue Latuda 120 mg daily and Wellbutrin 300 mg daily. Disc generic.  FU  9 mos bc is more stable for a couple of years  Meredith Staggers, MD, DFAPA   Please see After Visit Summary for patient specific  instructions.  No future appointments.   No orders of the defined types were placed in this encounter.     -------------------------------

## 2022-11-10 ENCOUNTER — Telehealth: Payer: Self-pay

## 2022-11-10 DIAGNOSIS — F3342 Major depressive disorder, recurrent, in full remission: Secondary | ICD-10-CM

## 2022-11-10 DIAGNOSIS — F29 Unspecified psychosis not due to a substance or known physiological condition: Secondary | ICD-10-CM

## 2022-11-10 NOTE — Telephone Encounter (Signed)
Received a prior authorization request for Latuda 120 mg #90 from [facility] but per pt's insurance only a 30 day supply can be submitted to a local pharmacy. If pt wants to use mail order with [facility] she can get it at a cheaper cost.   Please find out what pt prefers to do or what she has been doing. If using local for a 30 day supply cost would be over $10.00 per tablet, over $300.00 per month.

## 2022-11-11 NOTE — Telephone Encounter (Signed)
LVM to [facility] 

## 2022-11-11 NOTE — Telephone Encounter (Signed)
Notified patient. She said she had been paying out of pocket. She said [facility] called her yesterday and told her it would be $146.00. She said she didn't want to use mail order.

## 2022-11-11 NOTE — Telephone Encounter (Signed)
Is she getting  brand?   I don't have a note about it.  Generic Latuda with [facility] is less than $30/month

## 2022-11-13 ENCOUNTER — Telehealth: Payer: Self-pay

## 2022-11-13 NOTE — Telephone Encounter (Signed)
Prior Authorization Lurasidone 120 mg  #30/30 [facility]  Approved Effective:  11/11/22-11/10/25

## 2023-06-22 ENCOUNTER — Encounter: Payer: Self-pay | Admitting: Psychiatry

## 2023-06-22 ENCOUNTER — Ambulatory Visit: Payer: 59 | Admitting: Psychiatry

## 2023-06-22 DIAGNOSIS — F3342 Major depressive disorder, recurrent, in full remission: Secondary | ICD-10-CM | POA: Diagnosis not present

## 2023-06-22 DIAGNOSIS — Z8659 Personal history of other mental and behavioral disorders: Secondary | ICD-10-CM

## 2023-06-22 DIAGNOSIS — F29 Unspecified psychosis not due to a substance or known physiological condition: Secondary | ICD-10-CM

## 2023-06-22 MED ORDER — LURASIDONE HCL 120 MG PO TABS: 120.0000 mg | ORAL_TABLET | Freq: Every day | ORAL | 3 refills | Status: AC

## 2023-06-22 MED ORDER — BUPROPION HCL ER (XL) 300 MG PO TB24: 300.0000 mg | ORAL_TABLET | Freq: Every day | ORAL | 3 refills | Status: AC

## 2023-06-22 NOTE — Progress Notes (Signed)
 Miranda Norman [id number] May 23, 1980 43 y.o.  Subjective:   Patient ID:  Miranda Norman is a 43 y.o. (DOB 1980-02-16) female.  Chief Complaint:  Chief Complaint  Patient presents with   Follow-up    Miranda Norman presents to the office today for follow-up of a psychotic disorder, NOS and major depression.  10/2019 appt..  No med changes were made and she was doing well on her medications. Remained on Wellbutrin  450 and Latuda  80.  Covid vaccinated.  12/27/2019 phone call after hours: Patient started having hallucinations of voices talking about Satan and Jesus from the TV.  She had been consistent with her medication including Wellbutrin  and Latuda  80 mg daily.  It was suggested she increase Latuda  to 120 mg or 160 mg until she could get in with Dr. Paulla Bossier.  She was feeling very anxious and was prescribed lorazepam .  01/03/2020 appointment urgently scheduled due to recent recurrence of psychosis.  The following was noted: Weird things happened with song on TV that seemed to talk about Colleen Dawn and God.  Was getting confused by other things she heard and saw on TV.  Callled office to increase latuda  to 120 mg daily and RX Ativan  0.5 mg TID prn.  Didn't have the drug card or right dose to get 120 mg.  Started 120 mg 5 days ago.   Hard to say what's going on bc has been very sheltered since that time.  Was fine when people were with her that she knows well.  Turned off the TV and radio to limit stimuli.   Covid booster Friday 12/10 and felt bad after it for a couple of days.   Had episode where she felt like she was fainting and might die and go to hell.   No other occ of unusual missing meds or not taking it with food.   No preexisting problems with sleep or depression.  Some work stress lately.  Usually mother stays with her and for a couple of months has been more alone.   Sleepy with increase Latuda  to 120 mg.  Some sleep to escape.    Scared of recurrent psychosis and  rehospitalization.  Taking Latuda  with dinner.  Usually bed 9 and up at 6.  Latey more sleep. Not worked this week. Started [facility] hormone October 2020 Lo Loestrin FE Has notes. Plan: Agree with increase to Latuda  120 mg daily. Reduce Wellbutrin  to 300 mg bc activation may make sx worse.   01/12/2020 urgent appointment to follow up relapse psychosis with following noted: Urgent appointment made today.  Because of recent psychosis. I've been good.  Back to work Monday and it went OK.  Kind of a normal week.   Anxiety is better.  Tolerates Ativan  1 mg AM.  Sleep good. No paranoid thoughts since last here.  No AH.  Not depressed.  No panic.   Tolerates meds except mild nausea after eating pm briefly. Mild sleepiness. Patient reports stable mood and denies depressed or irritable moods.  Patient denies any recent difficulty with anxiety.  Patient denies difficulty with sleep initiation or maintenance. Denies appetite disturbance.  Patient reports that energy and motivation have been good.  Patient denies any difficulty with concentration.  Patient denies any suicidal ideation.  Plan: no med changes  03/04/20 appt wit following noted: Good.  Mood and anxiety good.  No psychosis since here.  Good work function.   Tolerates meds well.   Mild nausea not bad lately. Patient reports stable  mood and denies depressed or irritable moods.  Patient denies any recent difficulty with anxiety.  Patient denies difficulty with sleep initiation or maintenance. Denies appetite disturbance.  Patient reports that energy and motivation have been good.  Patient denies any difficulty with concentration.  Patient denies any suicidal ideation. Plan no changes  6/22 /2022 appointment with the following noted: Still good with meds and mood. Mild nausea otherwise no SE. Latuda  with a meal. Patient reports stable mood and denies depressed or irritable moods.  Patient denies any recent difficulty with anxiety.  Patient denies  difficulty with sleep initiation or maintenance. Denies appetite disturbance.  Patient reports that energy and motivation have been good.  Patient denies any difficulty with concentration.  Patient denies any suicidal ideation.  Interest in rare plants. Plan: Continue Wellbutrin  300 mg every morning Continue Latuda  120 mg daily Continue lorazepam  as needed  12/12/2020 appointment with the following noted: Remains on Wellbutrin  300 mg every morning and Latuda  120 mg daily.  Not taking lorazepam  lately. Good still.   Patient reports stable mood and denies depressed or irritable moods.  Patient denies any recent difficulty with anxiety.  Patient denies difficulty with sleep initiation or maintenance. Denies appetite disturbance.  Patient reports that energy and motivation have been good.  Patient denies any difficulty with concentration.  Patient denies any suicidal ideation. Dad a pt here and unsure what's going on with him but has affected her.  He has some confusion which is difficult for her bc of her prior psychosis. He will hallucinate and lose things. M lives with her and father lives on a farm alone. No Ativan  since Jan. SE occ nausea  06/17/21 appt noted: Still doing well.  Remains on Wellbutrin  300 mg every morning and Latuda  120 mg daily.  With land trust for 17 years.   More expensive with generic Latuda .   No SE except occ mild nausea. Patient reports stable mood and denies depressed or irritable moods.  Patient denies any recent difficulty with anxiety.  Patient denies difficulty with sleep initiation or maintenance. Denies appetite disturbance.  Patient reports that energy and motivation have been good.  Patient denies any difficulty with concentration.  Patient denies any suicidal ideation. No paranoia.    12/18/21 appt noted: Continued previous meds and consistent. Stable dose 2 years. No ativan  used. Remained well since here.  Anxiety is pretty good but end o fyear stress.    Still enjoys her job. SE less nausea bu some sleepiness but less than risperidone did in the past.  Latuda  in the PM meal.  No unusual fear.  Sleep is good.  No paranoia. No change indicated. No med changes indicated Continue Latuda  120 mg daily and Wellbutrin  300 mg daily.  09/22/2022 appointment noted: Meds as above.  No SE usually except occ nausea.   Enjoys [place]aking[place]  [place]   GM 43 yo died today. Starting new job with [employer] and starts Oct 7.   Been with [employer] 18 years.  Will have better benefits.  Knows the people she will work with and for.  At peace with decision.   Mood has been good and stable.  No unusual fear or anxiety.   Sleep well.   Feb M major surgery for GI twisted and worked.  Doing better now.  But got so weak she fell and broke shoulder.  Relies on mother for a lot. Plan  no changes  06/22/27 appt noted:  Med: Latuda  120  mg daily and Wellbutrin  300 mg daily. Started new job in fall with [employer] with [employer].  Loves it and less stressful.  But drives to [place] 4 times weekly.  Work all over [employer].  Getting up early to drive to [facility]. Taking wellbutrin  at night bc schedule.  No trouble with sleep. Usually 8+ hours .   GM died in 09-28-23 at 43 yo. Occ N after Latuda .  Will take a shower and feel better.   No sig anxiety.   F has dx dementia: Lewy Body.  Triggering for her bc of his behavior.  Got confused and drove to [place].  Had to have placement.  Had SE Zyprexa and switched to Seroquel.  But aggressive when changing him.  In hospital again now.   No paranoia nor dep. Went 1 week with GI virus so couldn't take meds.  Had to go to hospital for fluids but no psych px.    Past Psychiatric Medication Trials: Latuda  80- 2017,  risperidone 2 mg twice daily high prolactin,  Abilify failed transition from risperidone  Wellbutrin  450 SE activation,  Lexapro 7.5, sertraline,  lorazepam  History of psychiatric hospitalization with  psychosis History Some anxiety and conflict over faith and prior psychosis.    Review of Systems:  Review of Systems  Constitutional:  Negative for fever.  Gastrointestinal:  Positive for nausea.  Musculoskeletal:  Negative for gait problem.  Neurological:  Negative for dizziness, tremors and weakness.  Psychiatric/Behavioral:  Negative for confusion. The patient is not nervous/anxious.        Please refer to HPI    Medications: I have reviewed the patient's current medications.  Current Outpatient Medications  Medication Sig Dispense Refill   celecoxib (CELEBREX) 200 MG capsule Take 200 mg by mouth daily.     LORazepam  (ATIVAN ) 0.5 MG tablet Take 1 tablet (0.5 mg total) by mouth every 8 (eight) hours as needed for anxiety. 60 tablet 1   buPROPion  (WELLBUTRIN  XL) 300 MG 24 hr tablet Take 1 tablet (300 mg total) by mouth daily. 90 tablet 3   Lurasidone  HCl (LATUDA ) 120 MG TABS Take 1 tablet (120 mg total) by mouth daily with supper. 90 tablet 3   No current facility-administered medications for this visit.    Medication Side Effects: None  Allergies:  Allergies  Allergen Reactions   Ceclor [Cefaclor]     Doesn't remember. Childhood reaction   Erythromycin     Doesn't remember. Childhood reaction   Penicillins Hives    Past Medical History:  Diagnosis Date   CMV (cytomegalovirus) (HCC) 2006   Hyperprolactinemia (HCC)    Thyroid  goiter 4 years ago   everything is normal    Family History  Problem Relation Age of Onset   Asthma Mother    Goiter Other    Other Neg Hx        hyperprolactinemia    Social History   Socioeconomic History   Marital status: Single    Spouse name: Not on file   Number of children: Not on file   Years of education: Not on file   Highest education level: Not on file  Occupational History   Not on file  Tobacco Use   Smoking status: Never   Smokeless tobacco: Never  Substance and Sexual Activity   Alcohol use: Yes    Comment:  occasional   Drug use: No   Sexual activity: Not on file  Other Topics Concern   Not on file  Social  History Narrative   Not on file   Social Drivers of Health   Financial Resource Strain: Not on file  Food Insecurity: Low Risk  (08/31/2022)   Received from [facility]   Hunger Vital Sign    Within the past 12 months, you worried that your food would run out before you got money to buy more: Never true    Within the past 12 months, the food you bought just didn't last and you didn't have money to get more. : Never true  Transportation Needs: No Transportation Needs (08/31/2022)   Received from [facility]    In the past 12 months, has lack of reliable transportation kept you from medical appointments, meetings, work or from getting things needed for daily living? : No  Physical Activity: Not on file  Stress: Not on file  Social Connections: Not on file  Intimate Partner Violence: Not on file    Past Medical History, Surgical history, Social history, and Family history were reviewed and updated as appropriate.   Please see review of systems for further details on the patient's review from today.   Objective:   Physical Exam:  There were no vitals taken for this visit.  Physical Exam Constitutional:      General: She is not in acute distress.    Appearance: She is well-developed.   Musculoskeletal:        General: No deformity.   Neurological:     Mental Status: She is alert and oriented to person, place, and time.     Cranial Nerves: No dysarthria.     Coordination: Coordination normal.   Psychiatric:        Attention and Perception: She is attentive. She does not perceive auditory hallucinations.        Mood and Affect: Mood is not anxious or depressed. Affect is not labile, blunt, angry or tearful.        Speech: Speech normal.        Behavior: Behavior normal.        Thought Content: Thought content is not paranoid or delusional. Thought  content does not include homicidal or suicidal ideation.        Cognition and Memory: Cognition normal.        Judgment: Judgment normal.     Comments: Insight intact. Paranoia resolved after increase Latuda  2021 Pleasant and upbeat.       Lab Review:     Component Value Date/Time   NA 138 04/04/2013 0915   K 4.3 04/04/2013 0915   CL 101 04/04/2013 0915   CO2 25 04/04/2013 0915   GLUCOSE 94 04/04/2013 0915   BUN 10 04/04/2013 0915   CREATININE 0.67 04/04/2013 0915   CALCIUM 9.3 04/04/2013 0915   PROT 6.9 04/04/2013 0915   ALBUMIN 3.8 04/04/2013 0915   AST 27 04/04/2013 0915   ALT 20 04/04/2013 0915   ALKPHOS 54 04/04/2013 0915   BILITOT 0.4 04/04/2013 0915   GFRNONAA >90 04/04/2013 0915   GFRAA >90 04/04/2013 0915       Component Value Date/Time   WBC 7.5 04/04/2013 0915   RBC 5.07 04/04/2013 0915   HGB 14.9 04/04/2013 0915   HCT 42.9 04/04/2013 0915   PLT PLATELET CLUMPS NOTED ON SMEAR, UNABLE TO ESTIMATE 04/04/2013 0915   MCV 84.6 04/04/2013 0915   MCH 29.4 04/04/2013 0915   MCHC 34.7 04/04/2013 0915   RDW 13.0 04/04/2013 0915   LYMPHSABS 1.8 04/04/2013 0915  MONOABS 0.4 04/04/2013 0915   EOSABS 0.2 04/04/2013 0915   BASOSABS 0.0 04/04/2013 0915    No results found for: POCLITH, LITHIUM   No results found for: PHENYTOIN, PHENOBARB, VALPROATE, CBMZ   .res Assessment: Plan:    Recurrent major depression in full remission (HCC) - Plan: Lurasidone  HCl (LATUDA ) 120 MG TABS, buPROPion  (WELLBUTRIN  XL) 300 MG 24 hr tablet  History of psychosis  Psychosis, unspecified psychosis type (HCC) - Plan: Lurasidone  HCl (LATUDA ) 120 MG TABS   Disc history of dep with psychosis well managed.  Continue Latuda  120 mg daily with meal 350 calories emphasized.  Option split dose if needed. It resolved  paranoia in December 2021.  Disc ways to deal with missed calories and Latuda  if that occurs.  Call if questions Disc timing of Latuda   Disc good prognosis with  good response to meds.  No mood swings.    Was very situationally depressed at the time of starting Wellbutrin . Continue Wellbutrin  300 mg daily.  Discussed potential metabolic side effects associated with atypical antipsychotics, as well as potential risk for movement side effects. Advised pt to contact office if movement side effects occur.  No evidence of TD  No Bz needed but woud like prn. We discussed the short-term risks associated with benzodiazepines including sedation and increased fall risk among others.  Discussed long-term side effect risk including dependence, potential withdrawal symptoms, and the potential eventual dose-related risk of dementia.  But recent studies from 2020 dispute this association between benzodiazepines and dementia risk. Newer studies in 2020 do not support an association with dementia.  No med changes indicated.  High relapse risk without meds.   Continue Latuda  120 mg daily and Wellbutrin  300 mg daily. Disc generic.  FU  9-12 mos bc is more stable for a couple of years  Nori Beat, MD, DFAPA   Please see After Visit Summary for patient specific instructions.  No future appointments.   No orders of the defined types were placed in this encounter.     -------------------------------

## 2024-06-21 ENCOUNTER — Ambulatory Visit: Admitting: Psychiatry
